# Patient Record
Sex: Female | Born: 1970 | Race: White | Hispanic: No | Marital: Single | Smoking: Never smoker
Health system: Southern US, Community
[De-identification: ages and names within clinical notes are randomized; demographics above are authoritative.]

## PROBLEM LIST (undated history)

## (undated) DIAGNOSIS — I493 Ventricular premature depolarization: Secondary | ICD-10-CM

## (undated) DIAGNOSIS — R5383 Other fatigue: Secondary | ICD-10-CM

## (undated) DIAGNOSIS — D649 Anemia, unspecified: Secondary | ICD-10-CM

## (undated) DIAGNOSIS — F419 Anxiety disorder, unspecified: Secondary | ICD-10-CM

## (undated) DIAGNOSIS — R000 Tachycardia, unspecified: Secondary | ICD-10-CM

## (undated) DIAGNOSIS — R011 Cardiac murmur, unspecified: Secondary | ICD-10-CM

## (undated) DIAGNOSIS — G629 Polyneuropathy, unspecified: Secondary | ICD-10-CM

## (undated) DIAGNOSIS — G479 Sleep disorder, unspecified: Secondary | ICD-10-CM

## (undated) DIAGNOSIS — R109 Unspecified abdominal pain: Secondary | ICD-10-CM

## (undated) DIAGNOSIS — F32A Depression, unspecified: Secondary | ICD-10-CM

## (undated) DIAGNOSIS — N939 Abnormal uterine and vaginal bleeding, unspecified: Secondary | ICD-10-CM

## (undated) DIAGNOSIS — F329 Major depressive disorder, single episode, unspecified: Secondary | ICD-10-CM

## (undated) DIAGNOSIS — J329 Chronic sinusitis, unspecified: Secondary | ICD-10-CM

## (undated) DIAGNOSIS — M2550 Pain in unspecified joint: Secondary | ICD-10-CM

## (undated) HISTORY — DX: Major depressive disorder, single episode, unspecified: F32.9

## (undated) HISTORY — DX: Tachycardia, unspecified: R00.0

## (undated) HISTORY — DX: Cardiac murmur, unspecified: R01.1

## (undated) HISTORY — DX: Unspecified abdominal pain: R10.9

## (undated) HISTORY — DX: Ventricular premature depolarization: I49.3

## (undated) HISTORY — DX: Polyneuropathy, unspecified: G62.9

## (undated) HISTORY — DX: Abnormal uterine and vaginal bleeding, unspecified: N93.9

## (undated) HISTORY — DX: Chronic sinusitis, unspecified: J32.9

## (undated) HISTORY — DX: Other fatigue: R53.83

## (undated) HISTORY — DX: Sleep disorder, unspecified: G47.9

## (undated) HISTORY — DX: Pain in unspecified joint: M25.50

## (undated) HISTORY — PX: NO PAST SURGERIES: SHX2092

## (undated) HISTORY — DX: Depression, unspecified: F32.A

---

## 2002-04-17 ENCOUNTER — Other Ambulatory Visit: Admission: RE | Admit: 2002-04-17 | Discharge: 2002-04-17 | Payer: Self-pay | Admitting: Obstetrics and Gynecology

## 2003-06-17 ENCOUNTER — Other Ambulatory Visit: Admission: RE | Admit: 2003-06-17 | Discharge: 2003-06-17 | Payer: Self-pay | Admitting: Obstetrics and Gynecology

## 2012-04-09 ENCOUNTER — Ambulatory Visit: Payer: BC Managed Care – PPO

## 2012-04-09 ENCOUNTER — Ambulatory Visit (INDEPENDENT_AMBULATORY_CARE_PROVIDER_SITE_OTHER): Payer: BC Managed Care – PPO | Admitting: Family Medicine

## 2012-04-09 VITALS — BP 124/81 | HR 90 | Temp 98.3°F | Resp 18 | Ht 66.0 in | Wt 139.0 lb

## 2012-04-09 DIAGNOSIS — R079 Chest pain, unspecified: Secondary | ICD-10-CM

## 2012-04-09 MED ORDER — KETOROLAC TROMETHAMINE 60 MG/2ML IM SOLN
60.0000 mg | Freq: Once | INTRAMUSCULAR | Status: AC
  Administered 2012-04-09: 60 mg via INTRAMUSCULAR

## 2012-04-09 MED ORDER — CYCLOBENZAPRINE HCL 10 MG PO TABS: 10.0000 mg | ORAL_TABLET | Freq: Two times a day (BID) | ORAL | Status: AC | PRN

## 2012-04-09 MED ORDER — GI COCKTAIL ~~LOC~~
30.0000 mL | Freq: Once | ORAL | Status: AC
  Administered 2012-04-09: 30 mL via ORAL

## 2012-04-09 NOTE — Progress Notes (Signed)
Date:  04/09/2012   Name:  PALOMA GRANGE   DOB:  [birth year]   MRN:  [id number]  PCP:  No primary provider on file.    Chief Complaint: Chest Pain   History of Present Illness:  [name] is a 41 y.o. very pleasant female patient who presents with the following:  Generally healthy.  This morning she woke up around 6 am feeling ok. She drank her coffee, and then developed right sided chest and back pain around 7 am.  She did not take anything for this, but came in to be seen when it did not resolve.  The pain is sharp, and it hurts to take a deep breath.    She has not recently been ill.  No cough, no hemoptysis, no calf pain. She exercises regularly- she walks and runs, and uses the elliptical trainer.  No trouble with CP wile exercising.    She has had a couple of similar episodes in the past- most recently in the fall of last year.  She was evaluated by her PCP Dr. Zachery Dauer with [facility]- had an EKG, CXR.  All looked ok, and she was diagnosed with MSK chest pain.    She does have a family history of CAD (mother) and lung cancer (father) but she herself is quite healthy as far as she knows  Never been a smoker.   There is no problem list on file for this patient.   No past medical history on file.  No past surgical history on file.  History  Substance Use Topics  . Smoking status: Never Smoker   . Smokeless tobacco: Not on file  . Alcohol Use: Not on file    No family history on file.  Not on File  Medication list has been reviewed and updated.  No current outpatient prescriptions on file prior to visit.    Review of Systems:  As per HPI- otherwise negative.   Physical Examination: Filed Vitals:   04/09/12 0933  BP: 144/91  Pulse: 85  Temp: 98.3 F (36.8 C)  Resp: 18   Filed Vitals:   04/09/12 0933  Height: 5\' 6"  (1.676 m)  Weight: 139 lb (63.05 kg)   Body mass index is 22.44 kg/(m^2). Ideal Body Weight: Weight in (lb) to have BMI = 25:  154.6   GEN: WDWN, NAD, Non-toxic, A & O x 3 HEENT: Atraumatic, Normocephalic. Neck supple. No masses, No LAD.  PEERL, EOMI, oropharynx wnl Ears and Nose: No external deformity. CV: RRR, No M/G/R. No JVD. No thrill. No extra heart sounds. PULM: CTA B, no wheezes, crackles, rhonchi. No retractions. No resp. distress. No accessory muscle use. ABD: S, NT, ND, +BS. No rebound. No HSM. EXTR: No c/c/e NEURO Normal gait.  PSYCH: Normally interactive. Conversant. Not depressed or anxious appearing.  Calm demeanor.  Able to reproduce CP by pressing on the muscles of her back to the right of her thoracic spine, and also by pressing on her right pectoralis muscle and her right side.   [facility] reading (PRIMARY) by  Dr. CXR- wnl.  CHEST - 2 VIEW  Comparison: 02/05/2011 [facility]).  Findings: Midline trachea. Normal heart size and mediastinal contours. No pleural effusion or pneumothorax. Mild nonspecific lower lobe predominant interstitial thickening; felt be similar to on the prior exam, given differences in technique. No lobar consolidation.  IMPRESSION: No acute cardiopulmonary disease.  EKG: NSR, no ST elevation or depression  GI cocktail: no difference  Toradol IM:  helped a little with her pain  Results for orders placed in visit on 04/09/12  D-DIMER, QUANTITATIVE      Component Value Range   D-Dimer, Quant 0.22  0.00 - 0.48 ug/mL-FEU   Discussed her case in detail with patient and her sister.  It seems likely that her pain is MSK, as her VS, CXR and EKG are all reassuring.  However, I cannot totally rule- out a cardiac problem, vascular problem or PE here at clinic.  Went over several options including ER evaluation and CT scan.  She decided on a D-dimer and trial of muscle relaxer and went home to await her D. Dimer result.  She agreed to proceed to the ED if she got worse in the meantime Assessment and Plan: 1. Chest pain  DG Chest 2 View, gi cocktail  (Maalox,Lidocaine,Donnatal), EKG 12-Lead, ketorolac (TORADOL) injection 60 mg, cyclobenzaprine (FLEXERIL) 10 MG tablet, D-dimer, quantitative   Normal D. Dimer.  [facility] with this result around 2:30 pm.  She reported that she was feeling a lot better.  The flexeril helped very much.  She agreed to let me know if her symptoms returned.  She will seek emergency care if severe symptoms occur.    Abbe Amsterdam, MD

## 2012-06-08 ENCOUNTER — Other Ambulatory Visit: Payer: Self-pay | Admitting: Family Medicine

## 2012-06-08 DIAGNOSIS — R0789 Other chest pain: Secondary | ICD-10-CM

## 2012-06-23 ENCOUNTER — Ambulatory Visit
Admission: RE | Admit: 2012-06-23 | Discharge: 2012-06-23 | Disposition: A | Discharge status: Home / Self Care | Payer: BC Managed Care – PPO | Source: Ambulatory Visit | Attending: Family Medicine | Admitting: Family Medicine

## 2012-06-23 DIAGNOSIS — R0789 Other chest pain: Secondary | ICD-10-CM

## 2013-04-25 ENCOUNTER — Other Ambulatory Visit: Payer: Self-pay | Admitting: Family Medicine

## 2013-04-25 DIAGNOSIS — N939 Abnormal uterine and vaginal bleeding, unspecified: Secondary | ICD-10-CM

## 2013-04-26 ENCOUNTER — Ambulatory Visit
Admission: RE | Admit: 2013-04-26 | Discharge: 2013-04-26 | Disposition: A | Discharge status: Home / Self Care | Payer: BC Managed Care – PPO | Source: Ambulatory Visit | Attending: Family Medicine | Admitting: Family Medicine

## 2013-04-26 DIAGNOSIS — N939 Abnormal uterine and vaginal bleeding, unspecified: Secondary | ICD-10-CM

## 2013-05-14 ENCOUNTER — Encounter: Payer: Self-pay | Admitting: Gynecologic Oncology

## 2013-05-15 ENCOUNTER — Ambulatory Visit: Payer: BC Managed Care – PPO | Attending: Gynecology | Admitting: Gynecology

## 2013-05-15 ENCOUNTER — Encounter: Payer: Self-pay | Admitting: Gynecology

## 2013-05-15 VITALS — BP 120/80 | HR 80 | Temp 99.8°F | Resp 18 | Ht 66.3 in | Wt 139.1 lb

## 2013-05-15 DIAGNOSIS — N946 Dysmenorrhea, unspecified: Secondary | ICD-10-CM | POA: Insufficient documentation

## 2013-05-15 DIAGNOSIS — N83209 Unspecified ovarian cyst, unspecified side: Secondary | ICD-10-CM | POA: Insufficient documentation

## 2013-05-15 DIAGNOSIS — D219 Benign neoplasm of connective and other soft tissue, unspecified: Secondary | ICD-10-CM | POA: Insufficient documentation

## 2013-05-15 DIAGNOSIS — N920 Excessive and frequent menstruation with regular cycle: Secondary | ICD-10-CM | POA: Insufficient documentation

## 2013-05-15 NOTE — Patient Instructions (Signed)
Dr Lisbeth Ply office will call you to schedule surgery

## 2013-05-15 NOTE — Progress Notes (Signed)
Consult Note: Gyn-Onc   [name] 42 y.o. female  Chief Complaint  Patient presents with  . Complex Ovarian Cyst    New Consult    Assessment : Dysmenorrhea, menometrorrhagia, associated with complex ovarian cysts and apparent uterine/endometrial fibroid or polyp. My impression that this constellation of symptoms and findings is most likely secondary to endometriosis and a fibroid. I recommend a surgical assessment to confirm the diagnosis.  Plan: I lengthy discussion with the patient and her friend regarding differential diagnosis and my opinion it is most likely endometriosis and a uterine fibroid causing her problems. She does reflect she is having severe dysmenorrhea 3-4 days each month and would like to do something definitive. We discussed management options including use oral, except as, Lupron, or more definitive surgery such as a hysterectomy with bilateral salpingo-oophorectomy. The patient is desirous of resolving this problem and therefore is inclined to go ahead with the hysterectomy and bilateral salpingo-oophorectomy.  I contacted Dr. Arelia Sneddon in his office will contact the patient to schedule surgery.   HPI:  42 year old seen in consultation request of Dr. Richardean Chimera regarding management of bilateral complex ovarian cysts. The patient reports in April she developed pelvic pain and some intermenstrual bleeding. Prior to that time the patient reports that she's had regular cyclic menses has had tablet no gynecologic issues. She's never been pregnant and has not used oral contraceptives in the past.  Imaging has been obtained revealing a uterus measuring 8.9 x 4 x 66.5 cm. There is an oval polypoid mass in the uterus measuring 3.1 x 2.0 x 2.0 cm. Attempts at performing an endometrial biopsy and a sonohysterogram have been unsuccessful. In addition the patient has bilateral ovarian cystic lesions measuring 5.6 x 4.2 x 5.4 cm. The contralateral ovary measures 5.9 x 4.9 x 5.9  cm. CA 125 was obtained and returned as 51 units per mL.  Patient has no past family history of breast or ovarian cancer.  Review of Systems:10 point review of systems is negative except as noted in interval history.   Vitals: Blood pressure 120/80, pulse 80, temperature 99.8 F (37.7 C), temperature source Oral, resp. rate 18, height 5' 6.3" (1.684 m), weight 139 lb 1.6 oz (63.095 kg).  Physical Exam: General : The patient is a healthy woman in no acute distress.  HEENT: normocephalic, extraoccular movements normal; neck is supple without thyromegally  Lynphnodes: Supraclavicular and inguinal nodes not enlarged  Abdomen: Soft, there is pain in the suprapubic region on palpation., no ascites, no organomegally, no masses, no hernias  Pelvic:  EGBUS: Normal female  Vagina: Normal, no lesions , there is blood in the vagina. Urethra and Bladder: Normal, non-tender  Cervix: Nulliparous Uterus: Anterior, slightly enlarged and boggy. Am unable to palpate adnexal masses although the patient is in some pain. Bi-manual examination: Non-tender; no adenxal masses or nodularity  Rectal: normal sphincter tone, no masses, no blood  Lower extremities: No edema or varicosities. Normal range of motion      Allergies  Allergen Reactions  . Shellfish Allergy     Face/lips goes numb    Past Medical History  Diagnosis Date  . Abnormal uterine bleeding   . Abdominal pain     History reviewed. No pertinent past surgical history.  No current outpatient prescriptions on file.   No current facility-administered medications for this visit.    History   Social History  . Marital Status: Single    Spouse Name: N/A    Number of  Children: N/A  . Years of Education: N/A   Occupational History  . Not on file.   Social History Main Topics  . Smoking status: Never Smoker   . Smokeless tobacco: Not on file  . Alcohol Use: Yes     Comment: once a week  . Drug Use: No  . Sexual Activity: No    Other Topics Concern  . Not on file   Social History Narrative  . No narrative on file    Family History  Problem Relation Age of Onset  . Heart disease Mother   . Diabetes Mother   . Lung cancer Father   . Diabetes Sister       Jeannette Corpus, MD 05/15/2013, 10:19 AM       Consult Note: Gyn-Onc   [name] 42 y.o. female  Chief Complaint  Patient presents with  . Complex Ovarian Cyst    New Consult    Assessment :  Plan:  Interval History:   HPI:  Review of Systems:10 point review of systems is negative except as noted in interval history.   Vitals: Blood pressure 120/80, pulse 80, temperature 99.8 F (37.7 C), temperature source Oral, resp. rate 18, height 5' 6.3" (1.684 m), weight 139 lb 1.6 oz (63.095 kg).  Physical Exam: General : The patient is a healthy woman in no acute distress.  HEENT: normocephalic, extraoccular movements normal; neck is supple without thyromegally  Lynphnodes: Supraclavicular and inguinal nodes not enlarged  Abdomen: Soft, non-tender, no ascites, no organomegally, no masses, no hernias  Pelvic:  EGBUS: Normal female  Vagina: Normal, no lesions  Urethra and Bladder: Normal, non-tender  Cervix: Surgically absent  Uterus: Surgically absent  Bi-manual examination: Non-tender; no adenxal masses or nodularity  Rectal: normal sphincter tone, no masses, no blood  Lower extremities: No edema or varicosities. Normal range of motion      Allergies  Allergen Reactions  . Shellfish Allergy     Face/lips goes numb    Past Medical History  Diagnosis Date  . Abnormal uterine bleeding   . Abdominal pain     History reviewed. No pertinent past surgical history.  No current outpatient prescriptions on file.   No current facility-administered medications for this visit.    History   Social History  . Marital Status: Single    Spouse Name: N/A    Number of Children: N/A  . Years of Education: N/A    Occupational History  . Not on file.   Social History Main Topics  . Smoking status: Never Smoker   . Smokeless tobacco: Not on file  . Alcohol Use: Yes     Comment: once a week  . Drug Use: No  . Sexual Activity: No   Other Topics Concern  . Not on file   Social History Narrative  . No narrative on file    Family History  Problem Relation Age of Onset  . Heart disease Mother   . Diabetes Mother   . Lung cancer Father   . Diabetes Sister       Jeannette Corpus, MD 05/15/2013, 10:19 AM

## 2013-05-25 ENCOUNTER — Ambulatory Visit: Payer: BC Managed Care – PPO | Admitting: Gynecology

## 2013-06-15 ENCOUNTER — Encounter (HOSPITAL_COMMUNITY): Payer: Self-pay | Admitting: Pharmacy Technician

## 2013-06-18 ENCOUNTER — Encounter (HOSPITAL_COMMUNITY)
Admission: RE | Admit: 2013-06-18 | Discharge: 2013-06-18 | Disposition: A | Discharge status: Home / Self Care | Payer: BC Managed Care – PPO | Source: Ambulatory Visit | Attending: Obstetrics and Gynecology | Admitting: Obstetrics and Gynecology

## 2013-06-18 ENCOUNTER — Encounter (HOSPITAL_COMMUNITY): Payer: Self-pay

## 2013-06-18 DIAGNOSIS — Z01812 Encounter for preprocedural laboratory examination: Secondary | ICD-10-CM | POA: Insufficient documentation

## 2013-06-18 DIAGNOSIS — Z01818 Encounter for other preprocedural examination: Secondary | ICD-10-CM | POA: Insufficient documentation

## 2013-06-18 HISTORY — DX: Anemia, unspecified: D64.9

## 2013-06-18 HISTORY — DX: Anxiety disorder, unspecified: F41.9

## 2013-06-18 LAB — CBC
HCT: 41.9 % (ref 36.0–46.0)
Platelets: 389 10*3/uL (ref 150–400)
RDW: 13.4 % (ref 11.5–15.5)
WBC: 7 10*3/uL (ref 4.0–10.5)

## 2013-06-18 NOTE — Patient Instructions (Signed)
Your procedure is scheduled on:06/25/13  Enter through the Main Entrance at :6am Pick up desk phone and dial [phone] and inform us of your arrival.  Please call [phone] if you have any problems the morning of surgery.  Remember: Do not eat food or drink liquids, including water, after midnight:Sunday  You may brush your teeth the morning of surgery.   DO NOT wear jewelry, eye make-up, lipstick,body lotion, or dark fingernail polish.  (Polished toes are ok) You may wear deodorant.  If you are to be admitted after surgery, leave suitcase in car until your room has been assigned. Patients discharged on the day of surgery will not be allowed to drive home. Wear loose fitting, comfortable clothes for your ride home.

## 2013-06-20 NOTE — H&P (Signed)
  Patient name  Sally-Anne, Wamble DICTATION#  [id number] CSN# [id number]  Juluis Mire, MD 06/20/2013 7:07 AM

## 2013-06-21 NOTE — H&P (Signed)
[name], [name] NO.:  [id number]  MEDICAL RECORD NO.:  [id number]  LOCATION:  PERIO                         FACILITY:  [facility]  PHYSICIAN:  Juluis Mire, M.D.   DATE OF BIRTH:  01/01/1971  DATE OF ADMISSION:  05/18/2013 DATE OF DISCHARGE:                             HISTORY & PHYSICAL   DATE OF SURGERY:  June 25, 2013, at [facility] here in [place].  HISTORY OF PRESENT ILLNESS:  The patient is a 42 year old nulligravida female, initially referred to our practice by Dr. Zachery Dauer for evaluation of abnormal uterine bleeding.  Cycles were monthly, occurring approximately every 3-1/2 weeks.  She has 5 days of flow, 2 days being heavy, changing pads every hour with clots and worsening pain and discomfort.  She had a previous ultrasound that revealed a possible polypoid mass seen within the endometrial cavity and complex cyst of both ovaries.  We did an ultrasound in the office.  We had an inability to do the saline infusion due to the inability to find the cervical os. Her right ovary had a 6 cm cyst diffusely filled with echoes, probably endometrioma.  Left ovary had a 3.9 x 3.5 complex cyst, diffusely filled with echoes and thick septation.  There was a 4 mm solid area, another multiple cyst that did look like bilateral endometriomas.  We did a CA- 125 on the patient.  It was mildly elevated with a value of 51.2.  She was referred to the GYN oncologist and saw Dr. De Blanch. He felt after evaluation that this likely represented endometriosis and recommended proceeding with hysterectomy with possibly bilateral salpingo-oophorectomy.  The patient concurs with this and is admitted at the present time.  Dr. De Blanch did not believe that this was a GYN malignancy.  ALLERGIES:  She is allergic to [facility].  MEDICATIONS:  She is on no medications.  PAST SURGERY HISTORY:  She has had no surgical procedures done and has had no  vaginal deliveries.  SOCIAL HISTORY:  There is no tobacco or alcohol use.  FAMILY HISTORY:  There is a history of heart disease and diabetes in her mother.  Father had lung cancer.  Sister also had diabetes.  REVIEW OF SYSTEMS:  Noncontributory.  PHYSICAL EXAMINATION:  VITAL SIGNS:  The patient is afebrile with stable vital signs. HEENT:  The patient is normocephalic.  Pupils equal, round, and reactive to light and accommodation.  Extraocular movements were intact.  Sclerae and conjunctivae were clear.  Oropharynx clear. NECK:  Without thyromegaly. BREASTS:  Not examined. LUNGS:  Clear. CARDIOVASCULAR:  Regular rate without murmurs or gallops. ABDOMEN:  Benign.  No masses, organomegaly, or tenderness. PELVIC:  Normal external genitalia.  Vaginal mucosa is clear.  Cervix unremarkable.  Uterus is of normal size and shape.  She has bilateral adnexal fullness and tenderness consistent with known complex cyst. EXTREMITIES:  Trace edema. NEUROLOGIC:  Grossly normal.  IMPRESSION:  Severe pelvic endometriosis with bilateral endometriomas.  PLAN:  Options have been discussed for management.  We can be conservative and proceed with laparoscopic evaluation with attempt of bilateral ovarian cystectomy.  We can suppress with Lupron prior to this.  She wants to proceed with more  definitive surgery.  Therefore, we are going to proceed with an abdominal approach to the hysterectomy.  We had extreme difficulty even doing a Pap smear on her.  Therefore, I think a vaginal approach would be excluded.  She states that if one ovaries look normal, possibly leaving that would be an option.  If we do a total abdominal hysterectomy and bilateral salpingo-oophorectomy, she will definitely need hormone replacement therapy, __________ symptomatology and reduction of cardiovascular issues.  The patient expressed her understanding __________ obviously we could run into a malignancy.  If that is true, further  staging surgical procedures may be indicated.  The risks of surgery explained including the risk of infection, the risk of hemorrhage, could require transfusion with the risk of AIDS or hepatitis.  Risk of injury to adjacent organs such as bladder, bowel, or ureters that could require further exploratory surgery.  Risk of deep venous thrombosis and pulmonary embolus.  The patient expressed her understanding of the indications, risks, and alternatives.     Juluis Mire, M.D.     JSM/MEDQ  D:  06/20/2013  T:  06/20/2013  Job:  [id number]

## 2013-06-24 MED ORDER — CIPROFLOXACIN IN D5W 400 MG/200ML IV SOLN
400.0000 mg | INTRAVENOUS | Status: AC
  Administered 2013-06-25: 400 mg via INTRAVENOUS
  Filled 2013-06-24: qty 200

## 2013-06-24 MED ORDER — CLINDAMYCIN PHOSPHATE 900 MG/50ML IV SOLN
900.0000 mg | INTRAVENOUS | Status: AC
  Administered 2013-06-25: 900 mg via INTRAVENOUS
  Filled 2013-06-24: qty 50

## 2013-06-25 ENCOUNTER — Encounter (HOSPITAL_COMMUNITY)
Admission: RE | Disposition: A | Discharge status: Home / Self Care | Payer: Self-pay | Source: Ambulatory Visit | Attending: Obstetrics and Gynecology

## 2013-06-25 ENCOUNTER — Encounter (HOSPITAL_COMMUNITY): Payer: Self-pay | Admitting: *Deleted

## 2013-06-25 ENCOUNTER — Inpatient Hospital Stay (HOSPITAL_COMMUNITY): Payer: BC Managed Care – PPO | Admitting: Anesthesiology

## 2013-06-25 ENCOUNTER — Encounter (HOSPITAL_COMMUNITY): Payer: Self-pay | Admitting: Anesthesiology

## 2013-06-25 ENCOUNTER — Observation Stay (HOSPITAL_COMMUNITY)
Admission: RE | Admit: 2013-06-25 | Discharge: 2013-06-27 | Disposition: A | Discharge status: Home / Self Care | Payer: BC Managed Care – PPO | Source: Ambulatory Visit | Attending: Obstetrics and Gynecology | Admitting: Obstetrics and Gynecology

## 2013-06-25 DIAGNOSIS — N831 Corpus luteum cyst of ovary, unspecified side: Secondary | ICD-10-CM | POA: Insufficient documentation

## 2013-06-25 DIAGNOSIS — N809 Endometriosis, unspecified: Secondary | ICD-10-CM | POA: Diagnosis present

## 2013-06-25 DIAGNOSIS — Z90710 Acquired absence of both cervix and uterus: Secondary | ICD-10-CM

## 2013-06-25 DIAGNOSIS — N8000 Endometriosis of the uterus, unspecified: Secondary | ICD-10-CM | POA: Insufficient documentation

## 2013-06-25 DIAGNOSIS — D250 Submucous leiomyoma of uterus: Secondary | ICD-10-CM | POA: Insufficient documentation

## 2013-06-25 DIAGNOSIS — N801 Endometriosis of ovary: Secondary | ICD-10-CM | POA: Insufficient documentation

## 2013-06-25 DIAGNOSIS — N80109 Endometriosis of ovary, unspecified side, unspecified depth: Secondary | ICD-10-CM | POA: Insufficient documentation

## 2013-06-25 DIAGNOSIS — N840 Polyp of corpus uteri: Secondary | ICD-10-CM | POA: Insufficient documentation

## 2013-06-25 DIAGNOSIS — N803 Endometriosis of pelvic peritoneum, unspecified: Principal | ICD-10-CM | POA: Insufficient documentation

## 2013-06-25 HISTORY — PX: ABDOMINAL HYSTERECTOMY: SHX81

## 2013-06-25 HISTORY — PX: SALPINGOOPHORECTOMY: SHX82

## 2013-06-25 SURGERY — HYSTERECTOMY, ABDOMINAL
Anesthesia: General | Site: Abdomen | Wound class: Clean Contaminated

## 2013-06-25 MED ORDER — MIDAZOLAM HCL 2 MG/2ML IJ SOLN
INTRAMUSCULAR | Status: AC
  Filled 2013-06-25: qty 2

## 2013-06-25 MED ORDER — LIDOCAINE HCL (CARDIAC) 20 MG/ML IV SOLN
INTRAVENOUS | Status: DC | PRN
  Administered 2013-06-25: 40 mg via INTRAVENOUS
  Administered 2013-06-25: 20 mg via INTRAVENOUS

## 2013-06-25 MED ORDER — NALOXONE HCL 0.4 MG/ML IJ SOLN: 0.4000 mg | INTRAMUSCULAR | Status: DC | PRN

## 2013-06-25 MED ORDER — HYDROMORPHONE 0.3 MG/ML IV SOLN
INTRAVENOUS | Status: DC
  Administered 2013-06-25: 11:00:00 via INTRAVENOUS
  Administered 2013-06-25: 2.59 mg via INTRAVENOUS
  Administered 2013-06-25: 2.33 mg via INTRAVENOUS
  Administered 2013-06-25: 21:00:00 via INTRAVENOUS
  Administered 2013-06-25: 2.39 mg via INTRAVENOUS
  Administered 2013-06-26 (×2): 0.799 mg via INTRAVENOUS
  Administered 2013-06-26: 0.999 mg via INTRAVENOUS
  Filled 2013-06-25 (×2): qty 25

## 2013-06-25 MED ORDER — BUPIVACAINE LIPOSOME 1.3 % IJ SUSP
20.0000 mL | Freq: Once | INTRAMUSCULAR | Status: AC
  Administered 2013-06-25: 20 mL
  Filled 2013-06-25: qty 20

## 2013-06-25 MED ORDER — LACTATED RINGERS IV BOLUS (SEPSIS)
500.0000 mL | Freq: Once | INTRAVENOUS | Status: AC
  Administered 2013-06-25: 500 mL via INTRAVENOUS

## 2013-06-25 MED ORDER — OXYCODONE-ACETAMINOPHEN 5-325 MG PO TABS
1.0000 | ORAL_TABLET | ORAL | Status: DC | PRN
  Administered 2013-06-26: 2 via ORAL
  Filled 2013-06-25: qty 2

## 2013-06-25 MED ORDER — PROPOFOL 10 MG/ML IV EMUL
INTRAVENOUS | Status: AC
  Filled 2013-06-25: qty 20

## 2013-06-25 MED ORDER — DIPHENHYDRAMINE HCL 50 MG/ML IJ SOLN
INTRAMUSCULAR | Status: AC
  Filled 2013-06-25: qty 1

## 2013-06-25 MED ORDER — MEPERIDINE HCL 25 MG/ML IJ SOLN: 6.2500 mg | INTRAMUSCULAR | Status: DC | PRN

## 2013-06-25 MED ORDER — SCOPOLAMINE 1 MG/3DAYS TD PT72
1.0000 | MEDICATED_PATCH | TRANSDERMAL | Status: DC
  Administered 2013-06-25: 1.5 mg via TRANSDERMAL

## 2013-06-25 MED ORDER — METOCLOPRAMIDE HCL 5 MG/ML IJ SOLN
10.0000 mg | Freq: Once | INTRAMUSCULAR | Status: AC | PRN
  Administered 2013-06-25: 10 mg via INTRAVENOUS

## 2013-06-25 MED ORDER — HYDROMORPHONE HCL PF 1 MG/ML IJ SOLN
INTRAMUSCULAR | Status: AC
  Filled 2013-06-25: qty 1

## 2013-06-25 MED ORDER — PHENYLEPHRINE 40 MCG/ML (10ML) SYRINGE FOR IV PUSH (FOR BLOOD PRESSURE SUPPORT)
PREFILLED_SYRINGE | INTRAVENOUS | Status: AC
  Filled 2013-06-25: qty 5

## 2013-06-25 MED ORDER — NEOSTIGMINE METHYLSULFATE 1 MG/ML IJ SOLN
INTRAMUSCULAR | Status: AC
  Filled 2013-06-25: qty 1

## 2013-06-25 MED ORDER — PROMETHAZINE HCL 25 MG/ML IJ SOLN
INTRAMUSCULAR | Status: AC
  Administered 2013-06-25: 6.25 mg
  Filled 2013-06-25: qty 1

## 2013-06-25 MED ORDER — ROCURONIUM BROMIDE 100 MG/10ML IV SOLN
INTRAVENOUS | Status: DC | PRN
  Administered 2013-06-25: 10 mg via INTRAVENOUS
  Administered 2013-06-25: 40 mg via INTRAVENOUS

## 2013-06-25 MED ORDER — SCOPOLAMINE 1 MG/3DAYS TD PT72
MEDICATED_PATCH | TRANSDERMAL | Status: AC
  Filled 2013-06-25: qty 1

## 2013-06-25 MED ORDER — SODIUM CHLORIDE 0.9 % IJ SOLN: 9.0000 mL | INTRAMUSCULAR | Status: DC | PRN

## 2013-06-25 MED ORDER — GLYCOPYRROLATE 0.2 MG/ML IJ SOLN
INTRAMUSCULAR | Status: DC | PRN
  Administered 2013-06-25: 0.6 mg via INTRAVENOUS

## 2013-06-25 MED ORDER — PROPOFOL 10 MG/ML IV BOLUS
INTRAVENOUS | Status: DC | PRN
  Administered 2013-06-25: 160 mg via INTRAVENOUS

## 2013-06-25 MED ORDER — ZOLPIDEM TARTRATE 5 MG PO TABS: 5.0000 mg | ORAL_TABLET | Freq: Every evening | ORAL | Status: DC | PRN

## 2013-06-25 MED ORDER — ROCURONIUM BROMIDE 50 MG/5ML IV SOLN
INTRAVENOUS | Status: AC
  Filled 2013-06-25: qty 1

## 2013-06-25 MED ORDER — METOCLOPRAMIDE HCL 5 MG/ML IJ SOLN
INTRAMUSCULAR | Status: AC
  Filled 2013-06-25: qty 2

## 2013-06-25 MED ORDER — GLYCOPYRROLATE 0.2 MG/ML IJ SOLN
INTRAMUSCULAR | Status: AC
  Filled 2013-06-25: qty 3

## 2013-06-25 MED ORDER — ONDANSETRON HCL 4 MG/2ML IJ SOLN
4.0000 mg | Freq: Four times a day (QID) | INTRAMUSCULAR | Status: DC | PRN
  Filled 2013-06-25: qty 2

## 2013-06-25 MED ORDER — KETOROLAC TROMETHAMINE 30 MG/ML IJ SOLN
INTRAMUSCULAR | Status: DC | PRN
  Administered 2013-06-25: 30 mg via INTRAVENOUS

## 2013-06-25 MED ORDER — ONDANSETRON HCL 4 MG/2ML IJ SOLN
4.0000 mg | Freq: Four times a day (QID) | INTRAMUSCULAR | Status: DC | PRN
  Administered 2013-06-25: 4 mg via INTRAVENOUS

## 2013-06-25 MED ORDER — NEOSTIGMINE METHYLSULFATE 1 MG/ML IJ SOLN
INTRAMUSCULAR | Status: DC | PRN
  Administered 2013-06-25: 4 mg via INTRAVENOUS

## 2013-06-25 MED ORDER — DIPHENHYDRAMINE HCL 50 MG/ML IJ SOLN
INTRAMUSCULAR | Status: DC | PRN
  Administered 2013-06-25: 25 mg via INTRAVENOUS

## 2013-06-25 MED ORDER — DEXAMETHASONE SODIUM PHOSPHATE 10 MG/ML IJ SOLN
INTRAMUSCULAR | Status: DC | PRN
  Administered 2013-06-25: 10 mg via INTRAVENOUS

## 2013-06-25 MED ORDER — KETOROLAC TROMETHAMINE 30 MG/ML IJ SOLN
INTRAMUSCULAR | Status: AC
  Filled 2013-06-25: qty 1

## 2013-06-25 MED ORDER — LACTATED RINGERS IV SOLN
INTRAVENOUS | Status: DC
  Administered 2013-06-25: 50 mL/h via INTRAVENOUS

## 2013-06-25 MED ORDER — ONDANSETRON HCL 4 MG PO TABS: 4.0000 mg | ORAL_TABLET | Freq: Four times a day (QID) | ORAL | Status: DC | PRN

## 2013-06-25 MED ORDER — FENTANYL CITRATE 0.05 MG/ML IJ SOLN
INTRAMUSCULAR | Status: AC
  Filled 2013-06-25: qty 5

## 2013-06-25 MED ORDER — MENTHOL 3 MG MT LOZG: 1.0000 | LOZENGE | OROMUCOSAL | Status: DC | PRN

## 2013-06-25 MED ORDER — ONDANSETRON HCL 4 MG/2ML IJ SOLN
INTRAMUSCULAR | Status: AC
  Filled 2013-06-25: qty 2

## 2013-06-25 MED ORDER — DIPHENHYDRAMINE HCL 50 MG/ML IJ SOLN: 12.5000 mg | Freq: Four times a day (QID) | INTRAMUSCULAR | Status: DC | PRN

## 2013-06-25 MED ORDER — ACETAMINOPHEN 325 MG PO TABS
650.0000 mg | ORAL_TABLET | ORAL | Status: DC | PRN
  Administered 2013-06-26: 650 mg via ORAL
  Filled 2013-06-25: qty 2

## 2013-06-25 MED ORDER — ONDANSETRON HCL 4 MG/2ML IJ SOLN
INTRAMUSCULAR | Status: DC | PRN
  Administered 2013-06-25: 4 mg via INTRAVENOUS

## 2013-06-25 MED ORDER — LACTATED RINGERS IV SOLN
INTRAVENOUS | Status: DC
  Administered 2013-06-25 – 2013-06-26 (×3): via INTRAVENOUS

## 2013-06-25 MED ORDER — DIPHENHYDRAMINE HCL 12.5 MG/5ML PO ELIX: 12.5000 mg | ORAL_SOLUTION | Freq: Four times a day (QID) | ORAL | Status: DC | PRN

## 2013-06-25 MED ORDER — FENTANYL CITRATE 0.05 MG/ML IJ SOLN
INTRAMUSCULAR | Status: DC | PRN
  Administered 2013-06-25 (×4): 50 ug via INTRAVENOUS

## 2013-06-25 MED ORDER — PHENYLEPHRINE HCL 10 MG/ML IJ SOLN
INTRAMUSCULAR | Status: DC | PRN
  Administered 2013-06-25: 80 ug via INTRAVENOUS
  Administered 2013-06-25 (×3): 40 ug via INTRAVENOUS

## 2013-06-25 MED ORDER — LIDOCAINE HCL (CARDIAC) 20 MG/ML IV SOLN
INTRAVENOUS | Status: AC
  Filled 2013-06-25: qty 5

## 2013-06-25 MED ORDER — HYDROMORPHONE HCL PF 1 MG/ML IJ SOLN
0.2500 mg | INTRAMUSCULAR | Status: DC | PRN
  Administered 2013-06-25 (×3): 0.5 mg via INTRAVENOUS

## 2013-06-25 MED ORDER — DEXAMETHASONE SODIUM PHOSPHATE 10 MG/ML IJ SOLN
INTRAMUSCULAR | Status: AC
  Filled 2013-06-25: qty 1

## 2013-06-25 MED ORDER — MIDAZOLAM HCL 2 MG/2ML IJ SOLN
INTRAMUSCULAR | Status: DC | PRN
  Administered 2013-06-25 (×2): 1 mg via INTRAVENOUS

## 2013-06-25 SURGICAL SUPPLY — 31 items
CANISTER SUCTION 2500CC (MISCELLANEOUS) ×3 IMPLANT
CLOTH BEACON ORANGE TIMEOUT ST (SAFETY) ×3 IMPLANT
DECANTER SPIKE VIAL GLASS SM (MISCELLANEOUS) IMPLANT
DRSG OPSITE POSTOP 4X10 (GAUZE/BANDAGES/DRESSINGS) ×1 IMPLANT
GLOVE BIO SURGEON STRL SZ7 (GLOVE) ×3 IMPLANT
GOWN STRL REIN XL XLG (GOWN DISPOSABLE) ×9 IMPLANT
NDL HYPO 25X1 1.5 SAFETY (NEEDLE) IMPLANT
NEEDLE HYPO 25X1 1.5 SAFETY (NEEDLE) IMPLANT
NS IRRIG 1000ML POUR BTL (IV SOLUTION) ×3 IMPLANT
PACK ABDOMINAL GYN (CUSTOM PROCEDURE TRAY) ×3 IMPLANT
PAD OB MATERNITY 4.3X12.25 (PERSONAL CARE ITEMS) ×3 IMPLANT
PROTECTOR NERVE ULNAR (MISCELLANEOUS) ×3 IMPLANT
SPONGE LAP 18X18 X RAY DECT (DISPOSABLE) ×5 IMPLANT
STAPLER VISISTAT 35W (STAPLE) ×3 IMPLANT
STRIP CLOSURE SKIN 1/4X4 (GAUZE/BANDAGES/DRESSINGS) ×3 IMPLANT
SUT CHROMIC 0 SH (SUTURE) IMPLANT
SUT CHROMIC 2 0 SH (SUTURE) IMPLANT
SUT CHROMIC 3 0 SH 27 (SUTURE) IMPLANT
SUT PDS AB 0 CT 36 (SUTURE) ×3 IMPLANT
SUT VIC AB 0 CT1 18XCR BRD8 (SUTURE) ×6 IMPLANT
SUT VIC AB 0 CT1 27 (SUTURE) ×3
SUT VIC AB 0 CT1 27XBRD ANBCTR (SUTURE) ×2 IMPLANT
SUT VIC AB 0 CT1 8-18 (SUTURE) ×6
SUT VIC AB 2-0 CT1 (SUTURE) ×3 IMPLANT
SUT VIC AB 2-0 SH 27 (SUTURE) ×3
SUT VIC AB 2-0 SH 27XBRD (SUTURE) IMPLANT
SUT VICRYL 0 TIES 12 18 (SUTURE) ×3 IMPLANT
SYR CONTROL 10ML LL (SYRINGE) IMPLANT
TOWEL OR 17X24 6PK STRL BLUE (TOWEL DISPOSABLE) ×6 IMPLANT
TRAY FOLEY CATH 14FR (SET/KITS/TRAYS/PACK) ×3 IMPLANT
WATER STERILE IRR 1000ML POUR (IV SOLUTION) ×2 IMPLANT

## 2013-06-25 NOTE — H&P (Signed)
  History and physical exam unchanged 

## 2013-06-25 NOTE — Brief Op Note (Signed)
06/25/2013  8:59 AM  PATIENT:  [name]  42 y.o. female  PRE-OPERATIVE DIAGNOSIS:  ENDOMETRIOSIS CPT 58150  POST-OPERATIVE DIAGNOSIS:  ENDOMETRIOSIS  PROCEDURE:  Procedure(s): HYSTERECTOMY ABDOMINAL (N/A) SALPINGO OOPHORECTOMY (Bilateral)  SURGEON:  Surgeon(s) and Role:    * Juluis Mire, MD - Primary    * Mitchel Honour, DO - Assisting  PHYSICIAN ASSISTANT:   ASSISTANTS: megan morris  ANESTHESIA:   general  EBL:  Total I/O In: 1500 [I.V.:1500] Out: 160 [Urine:10; Blood:150]  BLOOD ADMINISTERED:none  DRAINS: Urinary Catheter (Foley)   LOCAL MEDICATIONS USED:  OTHER enperel  SPECIMEN:  Source of Specimen:  uterus tubes and ovaries  DISPOSITION OF SPECIMEN:  PATHOLOGY  COUNTS:  YES  TOURNIQUET:  * No tourniquets in log *  DICTATION: .Other Dictation: Dictation Number [id number]  PLAN OF CARE: Admit for overnight observation  PATIENT DISPOSITION:  PACU - hemodynamically stable.   Delay start of Pharmacological VTE agent (>24hrs) due to surgical blood loss or risk of bleeding: no

## 2013-06-25 NOTE — Transfer of Care (Signed)
Immediate Anesthesia Transfer of Care Note  Patient: [name]  Procedure(s) Performed: Procedure(s): HYSTERECTOMY ABDOMINAL (N/A) SALPINGO OOPHORECTOMY (Bilateral)  Patient Location: PACU  Anesthesia Type:General  Level of Consciousness: awake, sedated and patient cooperative  Airway & Oxygen Therapy: Patient Spontanous Breathing and Patient connected to nasal cannula oxygen  Post-op Assessment: Report given to PACU RN and Post -op Vital signs reviewed and stable  Post vital signs: Reviewed and stable  Complications: No apparent anesthesia complications

## 2013-06-25 NOTE — Anesthesia Preprocedure Evaluation (Signed)
Anesthesia Evaluation  Patient identified by MRN, date of birth, ID band Patient awake    Reviewed: Allergy & Precautions, H&P , NPO status , Patient's Chart, lab work & pertinent test results  Airway       Dental   Pulmonary neg pulmonary ROS,          Cardiovascular negative cardio ROS      Neuro/Psych PSYCHIATRIC DISORDERS Anxiety negative neurological ROS     GI/Hepatic negative GI ROS, Neg liver ROS,   Endo/Other  negative endocrine ROS  Renal/GU negative Renal ROS     Musculoskeletal negative musculoskeletal ROS (+)   Abdominal   Peds  Hematology negative hematology ROS (+)   Anesthesia Other Findings   Reproductive/Obstetrics negative OB ROS                           Anesthesia Physical Anesthesia Plan  ASA: I  Anesthesia Plan: General   Post-op Pain Management:    Induction: Intravenous  Airway Management Planned: Oral ETT  Additional Equipment:   Intra-op Plan:   Post-operative Plan: Extubation in OR  Informed Consent: I have reviewed the patients History and Physical, chart, labs and discussed the procedure including the risks, benefits and alternatives for the proposed anesthesia with the patient or authorized representative who has indicated his/her understanding and acceptance.   Dental advisory given  Plan Discussed with: CRNA  Anesthesia Plan Comments:         Anesthesia Quick Evaluation

## 2013-06-25 NOTE — Anesthesia Postprocedure Evaluation (Signed)
  Anesthesia Post-op Note  Patient: [name]  Procedure(s) Performed: Procedure(s): HYSTERECTOMY ABDOMINAL (N/A) SALPINGO OOPHORECTOMY (Bilateral)  Patient Location: Women's Unit  Anesthesia Type:General  Level of Consciousness: awake, alert , oriented and patient cooperative  Airway and Oxygen Therapy: Patient Spontanous Breathing and Patient connected to nasal cannula oxygen  Post-op Pain: moderate complaint of pain, nursing staff notified  Post-op Assessment: Patient's Cardiovascular Status Stable, Respiratory Function Stable, Patent Airway, No signs of Nausea or vomiting, Adequate PO intake and Pain level controlled  Post-op Vital Signs: Reviewed and stable  Complications: No apparent anesthesia complications

## 2013-06-25 NOTE — Anesthesia Postprocedure Evaluation (Signed)
  Anesthesia Post-op Note  Patient: [name]  Procedure(s) Performed: Procedure(s): HYSTERECTOMY ABDOMINAL (N/A) SALPINGO OOPHORECTOMY (Bilateral)  Patient Location: PACU  Anesthesia Type:General  Level of Consciousness: awake, alert  and oriented  Airway and Oxygen Therapy: Patient Spontanous Breathing  Post-op Pain: mild  Post-op Assessment: Post-op Vital signs reviewed, Patient's Cardiovascular Status Stable, Respiratory Function Stable, Patent Airway, NAUSEA AND VOMITING PRESENT and Pain level controlled  Post-op Vital Signs: Reviewed and stable  Complications: No apparent anesthesia complications other than PON. Patient had presumed allergic reaction to Ciprofloxacin intraoperative. Dr. Arelia Sneddon informed

## 2013-06-26 ENCOUNTER — Encounter (HOSPITAL_COMMUNITY): Payer: Self-pay | Admitting: Obstetrics and Gynecology

## 2013-06-26 LAB — CBC
HCT: 30.3 % — ABNORMAL LOW (ref 36.0–46.0)
Hemoglobin: 10.1 g/dL — ABNORMAL LOW (ref 12.0–15.0)
MCHC: 33.3 g/dL (ref 30.0–36.0)
MCV: 89.4 fL (ref 78.0–100.0)
RDW: 13.5 % (ref 11.5–15.5)
WBC: 14.6 10*3/uL — ABNORMAL HIGH (ref 4.0–10.5)

## 2013-06-26 MED ORDER — HYDROMORPHONE HCL 2 MG PO TABS
2.0000 mg | ORAL_TABLET | ORAL | Status: DC | PRN
  Administered 2013-06-26 – 2013-06-27 (×7): 2 mg via ORAL
  Filled 2013-06-26 (×7): qty 1

## 2013-06-26 MED ORDER — IBUPROFEN 600 MG PO TABS
600.0000 mg | ORAL_TABLET | Freq: Four times a day (QID) | ORAL | Status: DC | PRN
  Administered 2013-06-26 – 2013-06-27 (×3): 600 mg via ORAL
  Filled 2013-06-26 (×3): qty 1

## 2013-06-26 MED ORDER — DOCUSATE SODIUM 100 MG PO CAPS
100.0000 mg | ORAL_CAPSULE | Freq: Once | ORAL | Status: AC
  Administered 2013-06-26: 100 mg via ORAL
  Filled 2013-06-26: qty 1

## 2013-06-26 NOTE — Op Note (Signed)
[name], [name] NO.:  [id number]  MEDICAL RECORD NO.:  [id number]  LOCATION:  9305                          FACILITY:  [facility]  PHYSICIAN:  Juluis Mire, M.D.   DATE OF BIRTH:  April 06, 1971  DATE OF PROCEDURE:  06/25/2013 DATE OF DISCHARGE:                              OPERATIVE REPORT   PREOPERATIVE DIAGNOSIS:  Bilateral cystic enlargement of the ovaries with probable endometriosis.  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  OPERATIVE PROCEDURE:  Total abdominal hysterectomy with bilateral salpingo-oophorectomy.  SURGEON:  Juluis Mire, M.D.  ASSISTANT:  Meridee Score.  ANESTHESIA:  General endotracheal.  ESTIMATED BLOOD LOSS:  150 mL.  PACKS AND DRAINS:  Drains included urethral Foley.  INTRAOPERATIVE BLOOD PLACED:  None.  COMPLICATIONS:  None.  INDICATION:  Dictated in history and physical.  PROCEDURE:  As follows:  The patient was taken to the OR and placed in supine position.  After satisfactory level of general anesthesia was obtained, the abdomen was prepped with an antiseptic solution.  Vagina was also prepped with an antiseptic solution.  A Foley was placed to straight drain.  The patient then draped in sterile field.  A low- transverse skin incision was made with knife, carried through subcutaneous tissue.  Anterior rectus fascia then sharply incised and fashioned laterally.  Fascia taken off the muscle superiorly and inferiorly.  Rectus muscles were separated in midline.  Anterior perineum was entered sharply.  Incision of perineum extended both superiorly and inferiorly.  At this point in time, Lenox Ahr retractor was put in place.  Pelvic washings were obtained and sent for cytology.  Visualization showed cystic enlargement of the right ovary measuring about 6 cm consistent with endometrioma.  Left ovary was adherent to the sigmoid colon and also had an  endometrioma and adhesions.  We were able to free up first the right ovary just with blunt finger dissection.  We were able to do the same thing to the left tube and left ovary.  At this point in time, the cul-de-sac was also freed up.  We made sure the bowel was not adherent to the back of the uterus.  We did do some sharp dissection removing the tube and ovary from the sigmoid.  At this point in time, the right round ligament was clamped, cut, and suture ligated with 0-Vicryl.  The right utero-ovarian pedicle was isolated, clamped, and cut.  We secured the ovarian vascular with free tie of 0-Vicryl.  At this point in time, we elevated the ovary.  We developed retroperitoneal space on the right side, identified the ureter, isolated the ovarian vasculature above this, clamped it. Excised that and the ovary was passed off the operative field.  The pedicle was secured first with a free tie of 0-Vicryl then a suture ligature of 0-Vicryl.  At this point in time, the bladder flap was developed.  The right uterine vessels were clamped, cut, and suture ligated with 0-Vicryl.  We then went to the left side.  We developed the left retroperitoneal space.  We were able to identify the ureter, we isolated the ovarian vasculature above this, clamped it and cut, and  sutured this first with a free tie of 0-Vicryl then suture ligature of 0- Vicryl.  Keeping the ureter, I might be able to dissect the ovary and tube up to the side of the uterus.  The broad ligament was thick in this area and we dissected it out.  At this point in time, the bladder flap was fully developed, then the uterine vessels on the left side were clamped, cut, and suture ligated with 0-Vicryl.  Bladder was dissected superiorly off the lower uterine cervical segment using clamp, cut, and tie technique with suture ligature of 0-Vicryl, parametrium serially separated from the sized uterus.  Vaginal angles were then clamped and cut.   Intervening vaginal mucosa was excised and the uterus was passed off the operative field.  At this point in time, the vaginal angle was secured with suture ligatures of 0-Vicryl.  The intervening vaginal mucosa was closed with interrupted figure-of-eights of 0-Vicryl.  We then went to the left side.  There was a part of the ovary still adherent to the left pelvic sidewall.  We elevated this with a Babcock. Again, we identified the ureter and we were able to peel this off and send it for pathological specimen along with the rest of the ovary.  We irrigated the pelvis.  At this point in time, we had good hemostasis and clear urine output.  Appendix was visualized, noted to be normal and not involved.  Again, we irrigated the pelvis and had good hemostasis.  The Lenox Ahr retractor was removed along with the 2 packs. Peritoneum was closed with running suture of 3-0 Vicryl.  Fascia was closed with running suture of 0-PDS.  Skin was closed with a running subcuticular 4-0 Monocryl.  Exparel was injected subcutaneously prior to closing the skin.  We used approximately 20 mL.  Steri-Strips were also applied.  Sponge, instrument, and needle counts were reported as correct by circulating nurse x2.  Foley catheter remained clear at the time of closure.  The patient tolerated the procedure well, was returned to recovery room in good condition.     Juluis Mire, M.D.     JSM/MEDQ  D:  06/25/2013  T:  06/26/2013  Job:  [id number]

## 2013-06-26 NOTE — Progress Notes (Signed)
1 Day Post-Op Procedure([name]) (LRB): HYSTERECTOMY ABDOMINAL (N/A) SALPINGO OOPHORECTOMY (Bilateral)  Subjective: Patient reports incisional pain and tolerating PO.    Objective: I have reviewed patient'[name] vital signs and labs.  General: alert GI: soft, non-tender; bowel sounds normal; no masses,  no organomegaly and incision: clean Vaginal Bleeding: minimal  Assessment: [name]/p Procedure([name]): HYSTERECTOMY ABDOMINAL (N/A) SALPINGO OOPHORECTOMY (Bilateral): stable  Plan: Advance diet Discontinue IV fluids  LOS: 1 day    Michelle [name] 06/26/2013, 8:13 AM

## 2013-06-27 MED ORDER — DOCUSATE SODIUM 100 MG PO CAPS: 100.0000 mg | ORAL_CAPSULE | Freq: Two times a day (BID) | ORAL | Status: DC

## 2013-06-27 MED ORDER — HYDROMORPHONE HCL 2 MG PO TABS: 2.0000 mg | ORAL_TABLET | ORAL | Status: DC | PRN

## 2013-06-27 NOTE — Discharge Summary (Signed)
  Patient name  [name], [name] DICTATION# CSN# [id number]  [facility], MD 06/27/2013 8:28 AM

## 2013-06-27 NOTE — Progress Notes (Signed)
Pt is stable condition. Patient ambulated with sister to car. Walked with Heron Nay, RN. Will be going home. No equipment taken home.

## 2013-06-27 NOTE — Progress Notes (Signed)
2 Days Post-Op Procedure(s) (LRB): HYSTERECTOMY ABDOMINAL (N/A) SALPINGO OOPHORECTOMY (Bilateral)  Subjective: Patient reports incisional pain, tolerating PO and no problems voiding.    Objective: I have reviewed patient's vital signs.  General: alert and no distress GI: soft, non-tender; bowel sounds normal; no masses,  no organomegaly and incision: clean Vaginal Bleeding: minimal  Assessment: s/p Procedure(s): HYSTERECTOMY ABDOMINAL (N/A) SALPINGO OOPHORECTOMY (Bilateral): stable  Plan: Discharge home  LOS: 2 days    [name] S 06/27/2013, 8:27 AM

## 2013-06-28 NOTE — Discharge Summary (Signed)
[name], [name] NO.:  [id number]  MEDICAL RECORD NO.:  [id number]  LOCATION:  9305                          FACILITY:  [facility]  PHYSICIAN:  Juluis Mire, M.D.   DATE OF BIRTH:  September 22, 1971  DATE OF ADMISSION:  06/25/2013 DATE OF DISCHARGE:  06/27/2013                              DISCHARGE SUMMARY   ADMITTING DIAGNOSIS:  Pelvic pain with associated endometriosis.  POSTOPERATIVE DIAGNOSIS:  Pelvic pain with associated endometriosis.  OPERATIVE PROCEDURE:  Total abdominal hysterectomy with bilateral salpingo-oophorectomy.  For complete history and physical, please see dictated note.  COURSE IN THE HOSPITAL:  The patient underwent above-noted surgery.  She had severe endometriosis with bilateral endometriomas.  Postop, she did extremely well.  Her postop hemoglobin was 10.1.  She was discharged home on her second postop day.  At that time, she was afebrile.  Stable vital signs.  Her abdomen was soft.  She had some incisional tenderness. Bowel sounds were active.  Incision was clear.  She had minimal vaginal bleeding and voiding without difficulty.  Tolerating p.o. and was ambulating.  In terms of complication, cannot stay in the hospital.  The patient discharged home in stable condition.  DISPOSITION:  Routine postop instructions were given.  She is to avoid heavy lifting, vaginal entry, or driving a car.  She is to watch for signs of infection.  Report nausea, vomiting and report excessive pain and discomfort.  Report excessive vaginal bleeding.  Instructed signs and symptoms of deep venous thrombosis and pulmonary embolus.  She will be discharged home on Dilaudid as needed for pain and Colace as a stool softener.  Office will call her tomorrow and arrange followup on Monday where her dressing will be removed.     Juluis Mire, M.D.     JSM/MEDQ  D:  06/27/2013  T:  06/28/2013  Job:  [id number]

## 2013-07-19 ENCOUNTER — Other Ambulatory Visit: Payer: Self-pay | Admitting: Family Medicine

## 2013-07-19 DIAGNOSIS — R42 Dizziness and giddiness: Secondary | ICD-10-CM

## 2013-07-23 ENCOUNTER — Other Ambulatory Visit: Payer: BC Managed Care – PPO

## 2013-07-24 ENCOUNTER — Ambulatory Visit: Payer: BC Managed Care – PPO | Admitting: Neurology

## 2013-08-02 ENCOUNTER — Other Ambulatory Visit: Payer: Self-pay

## 2013-08-20 ENCOUNTER — Ambulatory Visit (INDEPENDENT_AMBULATORY_CARE_PROVIDER_SITE_OTHER): Payer: BC Managed Care – PPO | Admitting: Neurology

## 2013-08-20 ENCOUNTER — Encounter: Payer: Self-pay | Admitting: Neurology

## 2013-08-20 VITALS — BP 124/82 | HR 98 | Ht 66.5 in | Wt 138.0 lb

## 2013-08-20 DIAGNOSIS — R55 Syncope and collapse: Secondary | ICD-10-CM

## 2013-08-20 DIAGNOSIS — R42 Dizziness and giddiness: Secondary | ICD-10-CM

## 2013-08-20 DIAGNOSIS — R51 Headache: Secondary | ICD-10-CM

## 2013-08-20 DIAGNOSIS — R110 Nausea: Secondary | ICD-10-CM

## 2013-08-20 NOTE — Patient Instructions (Addendum)
Please remember, common headache triggers are: sleep deprivation, dehydration, overheating, stress, hypoglycemia or skipping meals and blood sugar fluctuations, excessive pain medications or excessive alcohol use or caffeine withdrawal. Some people have food triggers such as aged cheese, orange juice or chocolate, especially dark chocolate, or MSG (monosodium glutamate). Try to avoid these headache triggers as much possible. It may be helpful to keep a headache diary to figure out what makes your headaches worse or brings them on and what alleviates them. Some people report headache onset after exercise but studies have shown that regular exercise may actually prevent headaches from coming. If you have exercise-induced headaches, please make sure that you drink plenty of fluid before and after exercising and that you do not over do it and do not overheat.  I think overall you are doing fairly well but I do want to suggest a few things today:  Remember to drink plenty of fluid, eat healthy meals and do not skip any meals. Try to eat protein with a every meal and eat a healthy snack such as fruit or nuts in between meals. Try to keep a regular sleep-wake schedule and try to exercise daily, particularly in the form of walking, 20-30 minutes a day, if you can.   Vasovagal syncope is one of the most common causes of fainting. Vasovagal syncope occurs when your body overreacts to certain triggers, such as the sight of blood or extreme emotional distress or overheating.  The vasovagal syncope trigger causes a sudden drop in your heart rate and blood pressure, which leads to reduced blood flow to your brain, which results in a brief loss of consciousness. Vasovagal syncope is usually harmless and requires no treatment. However, if you collapse and fall, it is possible you may injure yourself.  Pre-sycopal symptoms include (but are not limited to): Skin paleness, lightheadedness, tunnel vision, nausea, a rising  feeling of warmth, feeling cold and clammy, excess yawning, and blurry vision. You may have jerky, abnormal movements, a slow and weak pulse and dilated pupils and you may have some confusion and mental slowing when you come to.  Common triggers for vasovagal syncope include (but are not limited to): Standing for long periods of time, heat exposure, the sight of blood or having blood drawn, fear and straining, such as during a bowel movement.  There is specific medication for fainting. Sometimes we use medications to keep the blood pressure elevated but this is rare. Supportive treatment includes foot exercises, wearing compression stockings or tensing your leg muscles when standing, increasing salt in your diet (unless you have high blood pressure). Avoid prolonged standing - especially in hot, crowded places - and drink plenty of fluids and change position from sitting to standing or lying to standing slowly.    As far as your medications are concerned, I would like to suggest no change.   As far as diagnostic testing: MRI brain with and without contrast. Renee Mansir will call you to schedule.   I would like to see you back as needed. We will call you with your MRI result.

## 2013-08-20 NOTE — Progress Notes (Signed)
Subjective:    [name] ID: [name] is a 42 y.o. female.  HPI   Huston Foley, MD, PhD [facility] [address] [address] [place], [place] [address]  Dear Dr. Zachery Dauer,  I saw your [name], [name], upon your kind request in my neurologic clinic today for initial consultation of her dizziness. [name] is unaccompanied today. As you know, [name] is a very pleasant 42 year old right-handed woman with an underlying medical history of sinus disease, depression, severe endometriosis, status post recent total abdominal hysterectomy and bilateral salpingo-oophorectomy, who complains of lightheadedness and dizziness for Michelle past several months. She's currently on an oral contraceptive pill, Zyrtec as needed, and sertraline 25 mg once daily. She c/o lightheaded, near-fainting feeling, no spinning, no vertigo, but cold sweat and nause. She had two syncopes as a teenager. This lasts for 30 minutes, but Michelle nausea persists. She has been seen by cardiology and by ENT. She reports Sx started in July and she felt, that perhaps after Michelle hysterectomy, but Sx did not improve. She has been scheduled for a CTH, but has not had it yet, as she was told to take Zyrtec as a trial for fluid in her ears. She has been taking it for Michelle past 2-3 weeks, with minimal improvement in her Sx. She has been having an intermittent, once a week on average HA in R frontal area, always seems to be in Michelle same spot. She feels faint and unwell. She has not lost weight, no staring spell, no twitching, no loss of time and no recent LOC. No Hx of concussion, no FHx of epilepsy and has a very rare Hx of migraines, perhaps once a year. Michelle R frontal HA is different from her migraines, not throbbing, feels like a sharper, more superficial pain. She had blood work including Thyroid, blood sugar, anemia testing, all of which was fine. She has 2 sisters and 1 brother. Her brother and one  sister have a tendency to faint, for example, at Michelle site of blood.  Never had TIA or stroke symptoms, denying sudden onset of one sided weakness, numbness, tingling, slurring of speech or droopy face, hearing loss, tinnitus, diplopia or visual field cut or monocular loss of vision.  Her Past Medical History Is Significant For: Past Medical History  Diagnosis Date  . Abnormal uterine bleeding   . Abdominal pain   . Anemia   . Anxiety     Her Past Surgical History Is Significant For: Past Surgical History  Procedure Laterality Date  . No past surgeries    . Abdominal hysterectomy N/A 06/25/2013    Procedure: HYSTERECTOMY ABDOMINAL;  Surgeon: Juluis Mire, MD;  Location: [facility];  Service: Gynecology;  Laterality: N/A;  . Salpingoophorectomy Bilateral 06/25/2013    Procedure: SALPINGO OOPHORECTOMY;  Surgeon: Juluis Mire, MD;  Location: [facility];  Service: Gynecology;  Laterality: Bilateral;    Her Family History Is Significant For: Family History  Problem Relation Age of Onset  . Heart disease Mother   . Diabetes Mother   . Lung cancer Father   . Diabetes Sister     Her Social History Is Significant For: History   Social History  . Marital Status: Single    Spouse Name: N/A    Number of Children: N/A  . Years of Education: N/A   Social History Main Topics  . Smoking status: Never Smoker   . Smokeless tobacco: None  . Alcohol Use: Yes  Comment: rarely  . Drug Use: No  . Sexual Activity: No   Other Topics Concern  . None   Social History Narrative  . None    Her Allergies Are:  Allergies  Allergen Reactions  . Ceftin [Cefuroxime Axetil] Swelling    Face numbing, tongue swelling  . Shellfish Allergy Swelling    Face/lips goes numb  . Ciprofloxacin Hives  :   Her Current Medications Are:  Outpatient Encounter Prescriptions as of 08/20/2013  Medication Sig  . cetirizine (ZYRTEC) 10 MG tablet Take 10 mg by mouth daily.  . sertraline (ZOLOFT) 25 MG tablet  Take 1 tablet by mouth daily.  Marland Kitchen ALPRAZolam (XANAX) 0.25 MG tablet Take 1 tablet by mouth 2 (two) times daily as needed for anxiety.   . docusate sodium (COLACE) 100 MG capsule Take 1 capsule (100 mg total) by mouth 2 (two) times daily.  . meclizine (ANTIVERT) 12.5 MG tablet   . naproxen sodium (ANAPROX) 550 MG tablet Take 1 tablet by mouth every 8 (eight) hours as needed (for pain).   . ondansetron (ZOFRAN) 8 MG tablet Take 1 tablet by mouth as needed.  . Prenatal Vit-Fe Fumarate-FA (PRENATAL MULTIVITAMIN) TABS tablet Take 1 tablet by mouth daily at 12 noon.  . [DISCONTINUED] HYDROmorphone (DILAUDID) 2 MG tablet Take 1 tablet (2 mg total) by mouth every 4 (four) hours as needed for pain.    Review of Systems:  Out of a complete 14 point review of systems, all are reviewed and negative with Michelle exception of these symptoms as listed below: Review of Systems  Constitutional: Negative.   Eyes: Negative.   Respiratory: Negative.   Cardiovascular: Negative.   Gastrointestinal: Negative.   Endocrine: Negative.   Genitourinary: Negative.   Musculoskeletal: Negative.   Skin: Negative.   Allergic/Immunologic: Negative.   Neurological: Positive for dizziness and headaches (Right Frontal area).  Hematological: Negative.   Psychiatric/Behavioral: Negative.     Objective:  Neurologic Exam  Physical Exam Physical Examination:   Filed Vitals:   08/20/13 1423  BP: 124/82  Pulse: 98   General Examination: [name] is a very pleasant 42 y.o. female in no acute distress. She appears well-developed and well-nourished and very well groomed.   HEENT: Normocephalic, atraumatic, pupils are equal, round and reactive to light and accommodation. Funduscopic exam is normal with sharp disc margins noted. Extraocular tracking is good without limitation to gaze excursion or nystagmus noted. Normal smooth pursuit is noted. Hearing is grossly intact. Tympanic membranes are clear bilaterally. Face is  symmetric with normal facial animation and normal facial sensation. Speech is clear with no dysarthria noted. There is no hypophonia. There is no lip, neck/head, jaw or voice tremor. Neck is supple with full range of passive and active motion. There are no carotid bruits on auscultation. Oropharynx exam reveals: mild mouth dryness, adequate dental hygiene and no airway crowding. Mallampati is class I. Tongue protrudes centrally and palate elevates symmetrically. Tonsils are small.   Chest: Clear to auscultation without wheezing, rhonchi or crackles noted.  Heart: S1+S2+0, regular and normal without murmurs, rubs or gallops noted.   Abdomen: Soft, non-tender and non-distended with normal bowel sounds appreciated on auscultation.  Extremities: There is no pitting edema in Michelle distal lower extremities bilaterally. Pedal pulses are intact.  Skin: Warm and dry without trophic changes noted. There are no varicose veins.  Musculoskeletal: exam reveals no obvious joint deformities, tenderness or joint swelling or erythema.   Neurologically:  Mental status: [name]  is awake, alert and oriented in all 4 spheres. Her memory, attention, language and knowledge are appropriate. There is no aphasia, agnosia, apraxia or anomia. Speech is clear with normal prosody and enunciation. Thought process is linear. Mood is congruent and affect is normal.  Cranial nerves are as described above under HEENT exam. In addition, shoulder shrug is normal with equal shoulder height noted. Motor exam: Normal bulk, strength and tone is noted. There is no drift, tremor or rebound. Romberg is negative. Reflexes are 2+ throughout. Toes are downgoing bilaterally. Fine motor skills are intact with normal finger taps, normal hand movements, normal rapid alternating patting, normal foot taps and normal foot agility.  Cerebellar testing shows no dysmetria or intention tremor on finger to nose testing. Heel to shin is unremarkable  bilaterally. There is no truncal or gait ataxia.  Sensory exam is intact to light touch, pinprick, vibration, temperature sense in Michelle upper and lower extremities.  Gait, station and balance are unremarkable. No veering to one side is noted. No leaning to one side is noted. Posture is age-appropriate and stance is narrow based. No problems turning are noted. She turns en bloc. Tandem walk is unremarkable. Intact toe and heel stance is noted.               Assessment and Plan:   In summary, ALILA SOTERO is a very pleasant 42 y.o.-year old female with a history of persistent . Her physical exam is non-focal. She is doing fairly well at this time and I reassured [name] in that regard.  I had a long chat with [name] about my findings and Michelle diagnosis of syncope, vasovagal syncope, presyncopal symptoms and headaches. Because she has been experiencing a recurrent headache in Michelle same area since onset of her lightheadedness I would like to proceed with a brain MRI with and without contrast. Furthermore, I talked her about headache triggers and vasovagal syncope and its triggers. She is advised that there may not be a specific medication that would help her. I am not sure that I would do anything else for her at this time. We will call her with her MRI results. She does not have a history concerning for epilepsy or TIA. She had some blood work through [employer]. I am not sure if she had any autoimmune or inflammatory markers done which I would add next time she has blood work in your office. I did not do any blood work today, as I was not sure what all she has had tested before. At this juncture, I advised her to change positions slowly, keep very well hydrated at all times, not skipping any meals, stay well rested. I can probably see her back on an as-needed basis. She was in agreement.  Thank you very much for allowing me to participate in Michelle care of this nice [name]. If I can be of any  further assistance to you please do not hesitate to call me at [phone].  Sincerely,   Huston Foley, MD, PhD

## 2013-09-05 ENCOUNTER — Ambulatory Visit (INDEPENDENT_AMBULATORY_CARE_PROVIDER_SITE_OTHER): Payer: BC Managed Care – PPO

## 2013-09-05 DIAGNOSIS — R55 Syncope and collapse: Secondary | ICD-10-CM

## 2013-09-05 DIAGNOSIS — R110 Nausea: Secondary | ICD-10-CM

## 2013-09-05 DIAGNOSIS — R51 Headache: Secondary | ICD-10-CM

## 2013-09-05 DIAGNOSIS — R42 Dizziness and giddiness: Secondary | ICD-10-CM

## 2013-09-06 MED ORDER — GADOPENTETATE DIMEGLUMINE 469.01 MG/ML IV SOLN: 13.0000 mL | Freq: Once | INTRAVENOUS | Status: AC | PRN

## 2013-09-07 NOTE — Progress Notes (Signed)
Quick Note:  Shared MR/brain normal with patient thru VM message per Dr Teofilo Pod findings ______

## 2013-09-07 NOTE — Progress Notes (Signed)
Quick Note:  Please call and advise the patient that the recent scan we did was within normal limits. We did a brain MRI with and without contrast, which showed normal findings. In particular, there were no acute findings, such as a stroke, or mass or blood products. No further action is required on this test at this time. Please remind patient to keep any upcoming appointments or tests and to call us with any interim questions, concerns, problems or updates. Thanks,  Kaityln Kallstrom, MD, PhD   ______ 

## 2013-10-30 ENCOUNTER — Encounter: Payer: Self-pay | Admitting: Cardiology

## 2013-10-30 DIAGNOSIS — R55 Syncope and collapse: Secondary | ICD-10-CM | POA: Insufficient documentation

## 2013-10-30 DIAGNOSIS — R42 Dizziness and giddiness: Secondary | ICD-10-CM

## 2013-10-31 ENCOUNTER — Ambulatory Visit (INDEPENDENT_AMBULATORY_CARE_PROVIDER_SITE_OTHER): Payer: BC Managed Care – PPO | Admitting: Interventional Cardiology

## 2013-10-31 ENCOUNTER — Encounter: Payer: Self-pay | Admitting: Interventional Cardiology

## 2013-10-31 VITALS — BP 124/84 | HR 84 | Ht 66.5 in | Wt 145.0 lb

## 2013-10-31 DIAGNOSIS — R42 Dizziness and giddiness: Secondary | ICD-10-CM

## 2013-10-31 NOTE — Patient Instructions (Signed)
Your physician has recommended that you wear a holter monitor. Holter monitors are medical devices that record the heart's electrical activity. Doctors most often use these monitors to diagnose arrhythmias. Arrhythmias are problems with the speed or rhythm of the heartbeat. The monitor is a small, portable device. You can wear one while you do your normal daily activities. This is usually used to diagnose what is causing palpitations/syncope (passing out).

## 2013-10-31 NOTE — Progress Notes (Signed)
Patient ID: [name], female   DOB: 1970-11-22, 43 y.o.   MRN: [id number]    [place], [address] [place], [place]  [address] Phone: [phone] Fax:  [phone]  Date:  10/31/2013   ID:  [name], DOB February 29, 1971, MRN [id number]  PCP:  Gerrit Heck, MD      History of Present Illness: [name] is a 43 y.o. female who has been lightheaded and nauseated intermittently several months ago, starting in July 2014. Prior to this, she was feeling normal. She has had fibroids and ovarian cysts.  The first time she got dizzy was while driving. It lasted 30 minutes. There was tingling in her head. No room spinning. Did not have to lie down. It resolved spontaneously. No prior issues with low blood sugar. She can have multiple episodes in a day. They can last 30 minutes to 2 hours. It is worse after she eats. Some days she has no problems.  She was exercising regularly before this. She does not feel like she can exercise.  She feels foggy now. Typical episodes are while she is standing or walking. No problems with prolonged standing. First thing in the morning, when walking to the bathroom, she can have a sensation of the room moving.  After these episodes, She had a hysterectomy in 9/14, for fibroids and cysts and endometriosis.    Sx have persisted.,  She can have 2 good days and then 3-4 days with intermittent dizziness.  No syncope.  She will sweat profusely and get dizzy, which can last up to 30 minutes.  Then sx resolve and she is better.  There are iter days where the sx are intermittent throughout.  HR when she not feeling well can be up to 130.  She has been going to the gym when she feels ok.  She does well on the treadmill.    BP has been low at times when she feels bad.  It is usually well controlled.  She will check her BP sitting and standing.  The BP stays the same but her HR will increase.  She initially thought that her symptoms would  improve after hysterectomy. This has not been the case. She is quite frustrated. She has seen several different specialists including ENT, neurology and cardiology without any resolution of her symptoms. She is inquiring as to whether a monitor would be helpful.    Wt Readings from Last 3 Encounters:  10/31/13 145 lb (65.772 kg)  08/20/13 138 lb (62.596 kg)  06/25/13 139 lb (63.05 kg)     Past Medical History  Diagnosis Date  . Abnormal uterine bleeding   . Abdominal pain   . Anemia   . Anxiety   . Depression   . Frequent sinus infections     Current Outpatient Prescriptions  Medication Sig Dispense Refill  . cetirizine (ZYRTEC) 10 MG tablet Take 10 mg by mouth daily.      Marland Kitchen estradiol (VIVELLE-DOT) 0.075 MG/24HR Place 1 patch onto the skin 2 (two) times a week.       No current facility-administered medications for this visit.    Allergies:    Allergies  Allergen Reactions  . Ceftin [Cefuroxime Axetil] Swelling    Face numbing, tongue swelling  . Shellfish Allergy Swelling    Face/lips goes numb  . Ciprofloxacin Hives    Social History:  The patient  reports that she has never smoked. She does not have any smokeless tobacco  history on file. She reports that she drinks alcohol. She reports that she does not use illicit drugs.   Family History:  The patient's family history includes Diabetes in her mother and sister; Heart attack in her mother; Heart disease in her mother; Lung cancer in her father.   ROS:  Please see the history of present illness. nausea, No  vomiting.  No fevers, chills.  No focal weakness.  No dysuria. lightheadedness   All other systems reviewed and negative.   PHYSICAL EXAM: VS:  BP 124/84  Pulse 84  Ht 5' 6.5" (1.689 m)  Wt 145 lb (65.772 kg)  BMI 23.06 kg/m2 Well nourished, well developed, in no acute distress HEENT: normal Neck: no JVD, no carotid bruits Cardiac:  normal S1, S2; RRR;  Lungs:  clear to auscultation bilaterally, no wheezing,  rhonchi or rales Abd: soft, nontender, no hepatomegaly Ext: no edema Skin: warm and dry Neuro:   no focal abnormalities noted      ASSESSMENT AND PLAN:  1. Lightheadedness/dizziness: Unclear etiology. Encourage hydration. She has had a negative stress test and echocardiogram back in September of 2014. We'll plan for 24-hour Holter monitor to see what her average heart rate is and r/o persistent tachycardia, given that she feels tachycardia at rest. Would consider referral to EP as well. She may need a longer-term monitor. She has not fully passed out but she feels like she is getting closer to this point. She does have a prodrome and is able to sit down before she falls.   Dizziness seemed most like a vasovagal reaction. I stressed the importance of trying to sit down immediately when she has any symptoms. She needs to try to stay well hydrated as well. She should eat regularly. She has had similar type of symptoms when she has blood drawn. Now , she feels sx are coming on without any stress.    Signed, Mina Marble, MD, [facility] 10/31/2013 8:26 AM

## 2013-11-06 ENCOUNTER — Encounter (INDEPENDENT_AMBULATORY_CARE_PROVIDER_SITE_OTHER): Payer: BC Managed Care – PPO

## 2013-11-06 ENCOUNTER — Encounter: Payer: Self-pay | Admitting: *Deleted

## 2013-11-06 DIAGNOSIS — R42 Dizziness and giddiness: Secondary | ICD-10-CM

## 2013-11-06 NOTE — Progress Notes (Signed)
Patient ID: [name], female   DOB: 03/11/1971, 43 y.o.   MRN: [id number] E-Cardio 24 hour holter monitor applied to patient.

## 2013-11-14 ENCOUNTER — Telehealth: Payer: Self-pay | Admitting: Interventional Cardiology

## 2013-11-14 NOTE — Telephone Encounter (Signed)
Called pt and told her I just received final report and I will have Dr. Irish Lack interpret tomorrow.

## 2013-11-14 NOTE — Telephone Encounter (Signed)
New Message  Pt called for Monitor results// Please assist//SR

## 2013-11-15 NOTE — Telephone Encounter (Signed)
Pt notified of results

## 2013-11-15 NOTE — Telephone Encounter (Signed)
Dr. Hassell Done Interpretation 24 HR Holter: One 2.5 sec run of PAC's, which would not explain dizziness. Occasional PVC's. No sustained pathologic arrhythmia. Occasional "heart pounding" with PVC.

## 2013-11-15 NOTE — Telephone Encounter (Signed)
Follow up ° ° ° ° ° °Pt want monitor results °

## 2013-11-21 ENCOUNTER — Telehealth: Payer: Self-pay | Admitting: Interventional Cardiology

## 2013-11-21 DIAGNOSIS — R42 Dizziness and giddiness: Secondary | ICD-10-CM

## 2013-11-21 NOTE — Telephone Encounter (Signed)
New Problem:  Pt is requesting a call back with her heart monitor results. Pt is requesting someone else besides the person who called last. Pt states the person that called last did not explain it good enough.

## 2013-11-21 NOTE — Telephone Encounter (Signed)
To Michelle

## 2013-11-21 NOTE — Telephone Encounter (Signed)
Called patient per request from [facility], [place].  Spoke with patient who would like to discuss the monitor findings with Dr. Irish Lack as patient has her journal and recorded what she was doing every time she pressed the button on the monitor.  Patient states she continues to experience symptoms every day and would like to discuss this with Dr. Irish Lack.  I advised patient that I will forward message to Dr. Irish Lack and speak with him in person so that he can follow up with the patient.  Patient thanked me for my help.

## 2013-12-05 NOTE — Telephone Encounter (Signed)
I Explained results of monitor to the patient. All questions were answered.  I do not think her persistent dizzy feeling is coming from a cardiac source. I suggested perhaps seeing an endocrinologist to help with her persistent dizziness, to evaluate for any type of adrenal insufficiency.

## 2013-12-06 NOTE — Telephone Encounter (Signed)
LMTRC

## 2013-12-06 NOTE — Telephone Encounter (Signed)
Pt wants referral. Referral sent.

## 2013-12-13 ENCOUNTER — Ambulatory Visit: Payer: BC Managed Care – PPO | Admitting: Interventional Cardiology

## 2013-12-21 ENCOUNTER — Ambulatory Visit (INDEPENDENT_AMBULATORY_CARE_PROVIDER_SITE_OTHER): Payer: BC Managed Care – PPO | Admitting: Internal Medicine

## 2013-12-21 ENCOUNTER — Other Ambulatory Visit (INDEPENDENT_AMBULATORY_CARE_PROVIDER_SITE_OTHER): Payer: BC Managed Care – PPO | Admitting: *Deleted

## 2013-12-21 ENCOUNTER — Encounter: Payer: Self-pay | Admitting: Internal Medicine

## 2013-12-21 VITALS — BP 104/62 | HR 90 | Temp 98.6°F | Resp 12 | Ht 66.5 in | Wt 148.0 lb

## 2013-12-21 DIAGNOSIS — R42 Dizziness and giddiness: Secondary | ICD-10-CM

## 2013-12-21 DIAGNOSIS — R55 Syncope and collapse: Secondary | ICD-10-CM

## 2013-12-21 LAB — CORTISOL
Cortisol, Plasma: 26.4 ug/dL
Cortisol, Plasma: 11.7 ug/dL
Cortisol, Plasma: 21.9 ug/dL

## 2013-12-21 MED ORDER — COSYNTROPIN 0.25 MG IJ SOLR: 0.2500 mg | Freq: Once | INTRAMUSCULAR | Status: DC

## 2013-12-21 MED ORDER — COSYNTROPIN 0.25 MG IJ SOLR
0.2500 mg | Freq: Once | INTRAMUSCULAR | Status: DC
Start: 2013-12-21 — End: 2013-12-21

## 2013-12-21 MED ORDER — COSYNTROPIN 0.25 MG IJ SOLR
0.2500 mg | Freq: Once | INTRAMUSCULAR | Status: AC
  Administered 2013-12-21: 0.25 mg via INTRAMUSCULAR

## 2013-12-21 NOTE — Progress Notes (Signed)
Patient ID: [name], female   DOB: Jun 19, 1971, 43 y.o.   MRN: [id number]   HPI  [name] is a 43 y.o.-year-old female, referred by her cardiologist, Dr. Irish Lack, for evaluation for dizziness, nausea and syncope >> evaluate for adrenal insufficiency.  Pt describes that she started to be lightheaded in 03/2013. Until then >> very healthy. She was checked for anemia in 05/2013:  Lab Results  Component Value Date   WBC 14.6* 06/26/2013   HGB 10.1* 06/26/2013   HCT 30.3* 06/26/2013   MCV 89.4 06/26/2013   PLT 295 06/26/2013   She has a TAH + BSO in 05/2013, after the above labs drawn >> last CBC: 10/10/2013: Hb 13.7. Despite improvement in Hb, the lightheadedness is getting worse. Coworkers told her her face gets white during the episodes. She does not get dizzy, but gets nauseated. She has these spells 4-5x a day, lasting 10-15 min each, then feels well. No associated HA. No preceding anxiety. She gets palpitations with the episodes. No AP. No sweating, but gets hot. She chews ginger as she has nausea frequently. She cannot walk for a long time as    She was seen by Dr Irish Lack on 10/31/2013 >> I reviewed his note. She wore a Holter monitor >> she had to press a button every time she has sxs >> had PVCs every time she pressed the button, but not long lasting (only 3-4 sec), and likely not the cause of dizziness. 2D Echo was normal. No tilt table test.   She checks her pulse and BP at home: pulse increases from 60 >> 100 when stands. BP usually 120/60. No episodes of high BPs. No herbal OTC meds.   She does not have weight loss >> since hysterectomy gained 10 lbs. She is afraid to exercise b/c of presyncope.  Did not notice being more tanned.   Mother is diabetic >> pt did check her sugars during on episodes: 70-90.   Stopped all diet sodas that she was drinking up until 05/2013. She limits salt.   She saw neurology (Dr Rexene Alberts) >> MRI brain negative.   She gets vago-vagal  syncope with lab draws.   I reviewed labs that pt brought from PCP (10/10/2013): - CBC with diff normal - CMP normal - ESR 3 - Ferritin 19.2 (11-306.8); Sat 18 (20-55%), iron 66 (50-212), TIBC 367 (250-450), Transferrin 262 (203-362) - RF <13 - TSH 0.50 (0.34-4.5) - ANA neg. - MRI brain w/w/o contrast: normal, except mild paranasal sinus inflammation  ROS: Constitutional: see HPI Eyes: no blurry vision, no xerophthalmia ENT: no sore throat, nodules palpated in throat, no dysphagia/odynophagia, no hoarseness Cardiovascular: no CP/SOB/+ palpitations/no leg swelling Respiratory: no cough/SOB Gastrointestinal: + N/no V/D/C Musculoskeletal: + muscle aches /no joint aches Skin: no rashes Neurological: no tremors/numbness/tingling/dizziness, + HA Psychiatric: + both: depression/anxiety  Past Medical History  Diagnosis Date  . Abnormal uterine bleeding   . Abdominal pain   . Anemia   . Anxiety   . Depression   . Frequent sinus infections    Past Surgical History  Procedure Laterality Date  . No past surgeries    . Abdominal hysterectomy N/A 06/25/2013    Procedure: HYSTERECTOMY ABDOMINAL;  Surgeon: Darlyn Chamber, MD;  Location: [facility];  Service: Gynecology;  Laterality: N/A;  . Salpingoophorectomy Bilateral 06/25/2013    Procedure: SALPINGO OOPHORECTOMY;  Surgeon: Darlyn Chamber, MD;  Location: [facility];  Service: Gynecology;  Laterality: Bilateral;   History   Social  History  . 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 Smoking status: Former Smoker - quit in 1993  . Smokeless tobacco: Not on file  . Alcohol Use: Yes     Comment: rarely  . Drug Use: No  . Sexual Activity: No   Current Outpatient Prescriptions on File Prior to Visit  Medication Sig Dispense Refill  . estradiol (VIVELLE-DOT) 0.075 MG/24HR Place 1 patch onto the skin 2 (two) times a week.      .  cetirizine (ZYRTEC) 10 MG tablet Take 10 mg by mouth daily.       No current facility-administered medications on file prior to visit.   Allergies  Allergen Reactions  . Ceftin [Cefuroxime Axetil] Swelling    Face numbing, tongue swelling  . Shellfish Allergy Swelling    Face/lips goes numb  . Ciprofloxacin Hives   Family History  Problem Relation Age of Onset  . Heart disease Mother   . Diabetes Mother   . Heart attack Mother   . Lung cancer Father   . Diabetes Sister    PE: BP 104/62  Pulse 90  Temp(Src) 98.6 F (37 C) (Oral)  Resp 12  Ht 5' 6.5" (1.689 m)  Wt 148 lb (67.132 kg)  BMI 23.53 kg/m2  SpO2 97%  LMP 06/08/2013 Wt Readings from Last 3 Encounters:  12/21/13 148 lb (67.132 kg)  10/31/13 145 lb (65.772 kg)  08/20/13 138 lb (62.596 kg)   Constitutional: normal weight, in NAD Eyes: PERRLA, EOMI, no exophthalmos ENT: moist mucous membranes, no thyromegaly, no cervical lymphadenopathy Cardiovascular: RRR, No MRG Respiratory: CTA B Gastrointestinal: abdomen soft, NT, ND, BS+ Musculoskeletal: no deformities, strength intact in all 4 Skin: moist, warm, no rashes, no darkening of buccal mucosa, no increased tanning Neurological: no tremor with outstretched hands, DTR normal in all 4  ASSESSMENT: 1. Lightheadedness/Nausea/Syncope  PLAN:  1. ? adrenal insufficiency Pt with sxs that can be attributed to Adrenal insufficiency: nausea, lightheadedness, near-syncope, HA. Of note, no weight loss, darkening of skin. No apparent exogenous cause.  - will check a cosyntropin stimulation test today, as the appt is first of the day - if this is normal, probably a tilt-table test would be the next investigation - She is on estradiol HRT, but this is a patch, so unlikely to influence Cortisol levels - I advise her not to stay away from salt - I will see the pt back if results are abnormal.  Office Visit on 12/21/2013  Component Date Value Ref Range Status  . Cortisol,  Plasma 12/21/2013 11.7   Final   AM:  4.3 - 22.4 ug/dLPM:  3.1 - 16.7 ug/dL  . Cortisol, Plasma 12/21/2013 21.9   Final   AM:  4.3 - 22.4 ug/dLPM:  3.1 - 16.7 ug/dL  . Cortisol, Plasma 12/21/2013 26.4   Final   AM:  4.3 - 22.4 ug/dLPM:  3.1 - 16.7 ug/dL   Stim test is negative for adrenal insufficiency. Probably next step is a tilt test. ACTH still pending, but not likely to change dx.  12/24/2013 ACTH normal, at 12.

## 2013-12-21 NOTE — Patient Instructions (Signed)
Please stop at the lab for the cosyntropin stimulation test. Will schedule a new appointment if this test is abnormal. If this is negative, would continue with a tilt table test.

## 2013-12-24 LAB — ACTH: C206 ACTH: 12 pg/mL (ref 6–50)

## 2014-02-11 ENCOUNTER — Institutional Professional Consult (permissible substitution): Payer: BC Managed Care – PPO | Admitting: Internal Medicine

## 2014-02-12 ENCOUNTER — Ambulatory Visit (INDEPENDENT_AMBULATORY_CARE_PROVIDER_SITE_OTHER): Payer: BC Managed Care – PPO | Admitting: Internal Medicine

## 2014-02-12 ENCOUNTER — Encounter: Payer: Self-pay | Admitting: Internal Medicine

## 2014-02-12 VITALS — BP 124/84 | HR 97 | Ht 66.5 in | Wt 143.5 lb

## 2014-02-12 DIAGNOSIS — R110 Nausea: Secondary | ICD-10-CM

## 2014-02-12 DIAGNOSIS — R42 Dizziness and giddiness: Secondary | ICD-10-CM

## 2014-02-12 DIAGNOSIS — I493 Ventricular premature depolarization: Secondary | ICD-10-CM

## 2014-02-12 NOTE — Patient Instructions (Addendum)
Your physician recommends that you continue on your current medications as directed. Please refer to the Current Medication list given to you today.  Increase your fluid intake  Start wearing an abdominal binder  [email or web address] ([place])   Your physician recommends that you schedule a follow-up appointment in: 3 weeks with Dr. Caryl Comes.

## 2014-02-12 NOTE — Progress Notes (Signed)
ELECTROPHYSIOLOGY CONSULT NOTE  Patient ID: [name], MRN: [id number], DOB/AGE: 1971-08-26 43 y.o. Admit date: (Not on file) Date of Consult: 02/12/2014  Primary Physician: [name], MD Primary Cardiologist: JV  Chief Complaint:  Syncope NV   HPI [name] is a 43 y.o. female  Seen at the request of Dr.Barnes/JV  Because of spells of lightheadedness and nausea.  She has hx of remote syncope. These episodes occurred as a girl and were associated with a stereotypical prodrome   she was then well until  July 2014. The first episode occurred while she was at a water park. Nausea and lightheadedness that abated with rest.  She then had infrequent episodes during the summer, typically lasting about 30 minutes. She was described these episodes as pale. Increasing fluid intake was recommended as is increasing salt. This seemed to have low impact.  She underwent an abdominal hysterectomy and oophorectomy as well as procedures involving her endometriosis. Since that time her symptoms have been significantly more persistent. 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  Her symptoms are largely relieved by sitting or prolonged standing is associated with dizziness and nausea. She has had no syncope. She was referred to [facility] where she underwent an evaluation by neurology this was negative. She also significant for tilt table testing. She has sinus tachycardia at the beginning there is no significant changes in blood pressure although there was a gradual from the 120s--99. Upon recumbency her heart rate normalized.  She has tried thigh-high support stockings without benefit.  She has previously been quite fit  but is unable to exercise.  Does not use caffeine or recreational drugs.  Cardiac evaluation has included a normal echo and normal stress test  Holter monitor 2/15 demonstrated heart rate excursion 42--139 mean rate of 80s mean rates in the early morning hours  for over 100.  She is also noted to have PVCs. This comprised  1.5% and apparently when she saw Dr. Haynes Kerns she had orthostatic bigeminy.    Blood work was reviewed. Her TSH is a little bit low. She is a history of anxiety and depression but this is not an active issue      Past Medical History  Diagnosis Date  . Abnormal uterine bleeding   . Abdominal pain   . Anemia   . Anxiety   . Depression   . Frequent sinus infections   . Peripheral neuropathy   . Sleep disturbance, unspecified   . Pain in joint, multiple sites   . Fatigue       Surgical History:  Past Surgical History  Procedure Laterality Date  . No past surgeries    . Abdominal hysterectomy N/A 06/25/2013    Procedure: HYSTERECTOMY ABDOMINAL;  Surgeon: Darlyn Chamber, MD;  Location: [facility];  Service: Gynecology;  Laterality: N/A;  . Salpingoophorectomy Bilateral 06/25/2013    Procedure: SALPINGO OOPHORECTOMY;  Surgeon: Darlyn Chamber, MD;  Location: [facility];  Service: Gynecology;  Laterality: Bilateral;     Home Meds: Prior to Admission medications   Medication Sig Start Date End Date Taking? Authorizing Provider  estradiol (VIVELLE-DOT) 0.075 MG/24HR Place 1 patch onto the skin 2 (two) times a week.   Yes Historical Provider, MD      AllergiesShe was also notAllergies  Allergen Reactions  . Ceftin [Cefuroxime Axetil] Swelling    Face numbing, tongue swelling  . Shellfish Allergy Swelling    Face/lips goes numb  . Ciprofloxacin Hives  History   Social History  . Marital Status: Single    Spouse Name: N/A    Number of Children: N/A  . Years of Education: N/A   Occupational History  . Not on file.   Social History Main Topics  . Smoking status: Never Smoker   . Smokeless tobacco: Not on file  . Alcohol Use: Yes     Comment: rarely  . Drug Use: No  . Sexual Activity: No   Other Topics Concern  . Not on file   Social History Narrative  . No narrative on file     Family History  Problem  Relation Age of Onset  . Heart disease Mother   . Diabetes Mother   . Heart attack Mother   . Lung cancer Father   . Diabetes Sister   . CAD Mother   . Lung cancer Father      ROS:  Please see the history of present illness.     All other systems reviewed and negative.    Physical Exam:   Blood pressure 124/84, pulse 97, height 5' 6.5" (1.689 m), weight 143 lb 8 oz (65.091 kg), last menstrual period 06/08/2013. General: Well developed, well nourished female in no acute distress. Head: Normocephalic, atraumatic, sclera non-icteric, no xanthomas, nares are without discharge. EENT: normal Lymph Nodes:  none Back: without scoliosis/kyphosis* , no CVA tendersness Neck: Negative for carotid bruits. JVD not elevated. Lungs: Clear bilaterally to auscultation without wheezes, rales, or rhonchi. Breathing is unlabored. Heart: RRR with S1 S2. No murmur , rubs, or gallops appreciated. Abdomen: Soft, non-tender, non-distended with normoactive bowel sounds. No hepatomegaly. No rebound/guarding. No obvious abdominal masses. Msk:  Strength and tone appear normal for age. Extremities: No clubbing or cyanosis. No edema.  Distal pedal pulses are 2+ and equal bilaterally. Skin: Warm and Dry Neuro: Alert and oriented X 3. CN III-XII intact Grossly normal sensory and motor function . Psych:  Responds to questions appropriately with a normal affect.      Labs: Cardiac Enzymes No results found for this basename: CKTOTAL, CKMB, TROPONINI,  in the last 72 hours CBC Lab Results  Component Value Date   WBC 14.6* 06/26/2013   HGB 10.1* 06/26/2013   HCT 30.3* 06/26/2013   MCV 89.4 06/26/2013   PLT 295 06/26/2013   PROTIME: No results found for this basename: LABPROT, INR,  in the last 72 hours Chemistry No results found for this basename: NA, K, CL, CO2, BUN, CREATININE, CALCIUM, LABALBU, PROT, BILITOT, ALKPHOS, ALT, AST, GLUCOSE,  in the last 168 hours Lipids No results found for this basename: CHOL,  HDL, LDLCALC, TRIG   BNP No results found for this basename: probnp   Miscellaneous Lab Results  Component Value Date   DDIMER 0.22 04/09/2012    Radiology/Studies:  No results found.  EKG:  Sinus rhythm at 55 Intervals 14/0 39 RSR in otherwise normal    Assessment and Plan:  Lightheadedness and nausea  Orthostatic intolerance  The symptom complex is suggestive of a dysautonomic syndrome given its orthostatic trigger and relief of orthostatic relief.  She has been using Zofran for nausea with some relief. She has  undertaken a variety of unsuccessful efforts to mitigate orthostatic intolerance including pressure support stockings salt and water intake.  The abrupt onset suggested there may be a post viral trigger although she does not remember a specific event.  Her blood pressure certainly sufficient that we would allow [place] to try a beta blocker I think  for now I would like to try our further nonmedicinal approaches including an abdominal binder, increasing fluid intake and efforts to regain some degree of exercise. Also have her shower at night instead of in the morning. We will see her in a few weeks.     Deboraha Sprang

## 2014-02-28 ENCOUNTER — Encounter: Payer: Self-pay | Admitting: Internal Medicine

## 2014-03-06 ENCOUNTER — Ambulatory Visit (INDEPENDENT_AMBULATORY_CARE_PROVIDER_SITE_OTHER): Payer: BC Managed Care – PPO | Admitting: Internal Medicine

## 2014-03-06 VITALS — BP 139/74 | HR 94 | Ht 66.0 in | Wt 144.0 lb

## 2014-03-06 DIAGNOSIS — I493 Ventricular premature depolarization: Secondary | ICD-10-CM

## 2014-03-06 DIAGNOSIS — I4949 Other premature depolarization: Secondary | ICD-10-CM

## 2014-03-06 DIAGNOSIS — R42 Dizziness and giddiness: Secondary | ICD-10-CM

## 2014-03-06 NOTE — Patient Instructions (Signed)
Prescriptions were given to you today to try --- DO NOT TAKE THESE MEDICATIONS AT THE SAME TIME. TRY ONE MEDICATION AT A TIME. 1) Atenolol 25 mg daily for 2 weeks -- if no improvement, stop and start #2 2) Metoprolol Succinate 25 mg for 2 weeks -- if no improvement, stop and start #3 3) Metoprolol Tartrate 25 mg twice daily for 2 weeks    Please call us once you have found the medication that works for you.  Your physician has recommended that you wear a holter monitor. Holter monitors are medical devices that record the heart's electrical activity. Doctors most often use these monitors to diagnose arrhythmias. Arrhythmias are problems with the speed or rhythm of the heartbeat. The monitor is a small, portable device. You can wear one while you do your normal daily activities. This is usually used to diagnose what is causing palpitations/syncope (passing out).  Your physician recommends that you schedule a follow-up appointment in: 12 weeks with Dr. Caryl Comes.

## 2014-03-06 NOTE — Progress Notes (Signed)
      Patient Care Team: [name], MD as PCP - General (Internal Medicine)   HPI  [name] is a 43 y.o. female Seen in followup for orthostatic intolerance with symptoms suggestive of at this autonomic syndrome. Was thought potentially be post viral given the relatively abrupt onset of her symptoms. No medicinal approaches were suggested including an abdominal binder fluid intake and exercise.  The abdominal binder was associated with palpitations and a fullness in her neck. He did not seem to attenuate her symptom she went  She went to see Dr. Joylene Draft. He did orthostatics and noted that she developed significant PVCs while standing and that this was associated with a marked worsening in her symptoms  She continues to work on fluid and salt repletion.  Past Medical History  Diagnosis Date  . Abnormal uterine bleeding   . Abdominal pain   . Anemia   . Anxiety   . Depression   . Frequent sinus infections   . Peripheral neuropathy   . Sleep disturbance, unspecified   . Pain in joint, multiple sites   . Fatigue     Past Surgical History  Procedure Laterality Date  . No past surgeries    . Abdominal hysterectomy N/A 06/25/2013    Procedure: HYSTERECTOMY ABDOMINAL;  Surgeon: Darlyn Chamber, MD;  Location: [facility];  Service: Gynecology;  Laterality: N/A;  . Salpingoophorectomy Bilateral 06/25/2013    Procedure: SALPINGO OOPHORECTOMY;  Surgeon: Darlyn Chamber, MD;  Location: [facility];  Service: Gynecology;  Laterality: Bilateral;    Current Outpatient Prescriptions  Medication Sig Dispense Refill  . estradiol (CLIMARA - DOSED IN MG/24 HR) 0.1 mg/24hr patch Place 0.1 mg onto the skin once a week.       No current facility-administered medications for this visit.    Allergies  Allergen Reactions  . Ceftin [Cefuroxime Axetil] Swelling    Face numbing, tongue swelling  . Shellfish Allergy Swelling    Face/lips goes numb  . Ciprofloxacin Hives    Review of Systems  negative except from HPI and PMH  Physical Exam BP 139/74  Pulse 94  Ht 5\' 6"  (1.676 m)  Wt 144 lb (65.318 kg)  BMI 23.25 kg/m2  LMP 06/08/2013 Well developed and nourished in no acute distress HENT normal Neck supple with JVP-flat Clear Regular rate and rhythm, no murmurs or gallops Abd-soft with active BS No Clubbing cyanosis edema Skin-warm and dry A & Oriented  Grossly normal sensory and motor function  Orthostatic tracings were reviewed from Dr. Haynes Kerns  it was noted that she had PVCs with RVOT morphology i.e. left bundle inferior axis   Assessment and  Plan  Orthostatic intolerance  Nausea   PVCs upon standing -left bundle inferior axis  She continues to symptoms of orthostatic intolerance not withstanding   relatively few objective measures of back while here in the office. Interestingly, Dr. Haynes Kerns is orthostatic ECG tracings show the development of PVCs with RVOT morphology. These are adrenergically sensitive.  It begs the question as to whether her symptoms are largely related to ventricular ectopy. To this end we'll get a 48 hour Holter. We'll also begin her on low-dose beta-blockade. We have reviewed side effects. We will regroup in about 12 weeks.  I will also anticipate discussing her situation with Dr. Mare Ferrari at [facility] to see if she could offer insights. She will continue Zofran for nausea.

## 2014-03-13 ENCOUNTER — Telehealth: Payer: Self-pay | Admitting: Internal Medicine

## 2014-03-13 NOTE — Telephone Encounter (Signed)
New message     Pt is on atenolol---bp is low.  Can she cut the medication in half?

## 2014-03-14 NOTE — Telephone Encounter (Signed)
Pt BP dropping as low as 80/40. Discussed this with Dr. Caryl Comes -- advised pt to stop Atenolol and start Metoprolol succinate.  Pt is agreeable to this and will call back if reoccurs.

## 2014-03-18 ENCOUNTER — Encounter: Payer: Self-pay | Admitting: *Deleted

## 2014-03-18 ENCOUNTER — Encounter (INDEPENDENT_AMBULATORY_CARE_PROVIDER_SITE_OTHER): Payer: BC Managed Care – PPO

## 2014-03-18 DIAGNOSIS — I4949 Other premature depolarization: Secondary | ICD-10-CM

## 2014-03-18 DIAGNOSIS — I493 Ventricular premature depolarization: Secondary | ICD-10-CM

## 2014-03-18 NOTE — Progress Notes (Signed)
Patient ID: [name], female   DOB: 1971/05/31, 43 y.o.   MRN: [id number] [facility] 48 hour holter monitor applied to patient.

## 2014-03-26 ENCOUNTER — Other Ambulatory Visit: Payer: Self-pay | Admitting: *Deleted

## 2014-03-26 MED ORDER — METOPROLOL SUCCINATE ER 25 MG PO TB24: 25.0000 mg | ORAL_TABLET | Freq: Every day | ORAL | Status: DC

## 2014-04-01 ENCOUNTER — Telehealth: Payer: Self-pay | Admitting: *Deleted

## 2014-04-01 NOTE — Telephone Encounter (Signed)
Discussed holter monitor results with patient. Dr. Caryl Comes reports relatively infrequent PVCs and wanted to know if pt taking beta blocker. Pt tells me that she is currently taking Toprol 25mg  and states she feels better than she did. She says that every now and then she can still feel funny heart beat, but overall somewhat improved. Instructed to call us if issues arise before next OV, she is agreeable to this.

## 2014-05-21 ENCOUNTER — Telehealth: Payer: Self-pay | Admitting: Internal Medicine

## 2014-05-21 NOTE — Telephone Encounter (Signed)
New message     Talk to a nurse regarding medication

## 2014-05-23 ENCOUNTER — Encounter: Payer: Self-pay | Admitting: Internal Medicine

## 2014-05-23 ENCOUNTER — Ambulatory Visit (INDEPENDENT_AMBULATORY_CARE_PROVIDER_SITE_OTHER): Payer: BC Managed Care – PPO | Admitting: Internal Medicine

## 2014-05-23 VITALS — BP 128/81 | HR 84 | Ht 66.5 in | Wt 144.4 lb

## 2014-05-23 DIAGNOSIS — R42 Dizziness and giddiness: Secondary | ICD-10-CM

## 2014-05-23 MED ORDER — FLECAINIDE ACETATE 50 MG PO TABS: 25.0000 mg | ORAL_TABLET | Freq: Two times a day (BID) | ORAL | Status: DC

## 2014-05-23 NOTE — Telephone Encounter (Signed)
C/o light-headedness all the time.  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 Pt scheduled to see Dr. Caryl Comes this afternoon. She is very grateful for this and relieved to be able to address this today.

## 2014-05-23 NOTE — Patient Instructions (Signed)
Your physician has recommended you make the following change in your medication:  1) START Flecainide 50 mg twice daily  Your physician has requested that you have an exercise tolerance test in 3 weeks. For further information please visit [email or web address]. Please also follow instruction sheet, as given.  (Preferably on a day that Dr. Caryl Comes is in the office)

## 2014-05-23 NOTE — Progress Notes (Signed)
Patient Care Team: [name], MD as PCP - General (Internal Medicine)   HPI  [name] is a 43 y.o. female Seen in followup for orthostatic intolerance with symptoms suggestive of at this autonomic syndrome. Was thought potentially be post viral given the relatively abrupt onset of her symptoms. No medicinal approaches were suggested including an abdominal binder fluid intake and exercise.  The abdominal binder was associated with palpitations and a fullness in her neck. It  did not seem to attenuate her symptom she went  She went to see Dr. Joylene Draft. He did orthostatics and noted that she developed significant PVCs while standing and that this was associated with a marked worsening in her symptoms We were not able to reproduce this. She wore a 24 Holter monitor that at 464 PVCs and some PACs and some atrial runs.  She was started on beta blocker and had initially a significant benefit  but this was then lost the context of low blood pressure which prompted her to discontinue the atenolol. She then tried metoprolol succinate which also helped for a while.her blood pressures improved somewhat surprisingly she continued to have problems with orthostatic dizziness. She has not been aware of palpitations at the same juncture.   She continues to work on fluid and salt repletion.  Past Medical History  Diagnosis Date  . Abnormal uterine bleeding   . Abdominal pain   . Anemia   . Anxiety   . Depression   . Frequent sinus infections   . Peripheral neuropathy   . Sleep disturbance, unspecified   . Pain in joint, multiple sites   . Fatigue     Past Surgical History  Procedure Laterality Date  . No past surgeries    . Abdominal hysterectomy N/A 06/25/2013    Procedure: HYSTERECTOMY ABDOMINAL;  Surgeon: Darlyn Chamber, MD;  Location: [facility];  Service: Gynecology;  Laterality: N/A;  . Salpingoophorectomy Bilateral 06/25/2013    Procedure: SALPINGO OOPHORECTOMY;  Surgeon:  Darlyn Chamber, MD;  Location: [place] [facility];  Service: Gynecology;  Laterality: Bilateral;    Current Outpatient Prescriptions  Medication Sig Dispense Refill  . estradiol (CLIMARA - DOSED IN MG/24 HR) 0.1 mg/24hr patch Place 0.1 mg onto the skin once a week.      . metoprolol succinate (TOPROL-XL) 25 MG 24 hr tablet Take 1 tablet (25 mg total) by mouth daily.  30 tablet  3   No current facility-administered medications for this visit.    Allergies  Allergen Reactions  . Ceftin [Cefuroxime Axetil] Swelling    Face numbing, tongue swelling  . Shellfish Allergy Swelling    Face/lips goes numb  . Ciprofloxacin Hives    Review of Systems negative except from HPI and PMH  Physical Exam BP 128/81  Pulse 84  Ht 5' 6.5" (1.689 m)  Wt 144 lb 6.4 oz (65.499 kg)  BMI 22.96 kg/m2  LMP 06/08/2013 Well developed and nourished in no acute distress HENT normal Neck supple with JVP-flat Clear Regular rate and rhythm, no murmurs or gallops Abd-soft with active BS No Clubbing cyanosis edema Skin-warm and dry A & Oriented  Grossly normal sensory and motor function  Orthostatic tracings were reviewed from Dr. Haynes Kerns  it was noted that she had PVCs with RVOT morphology i.e. left bundle inferior axis  Repeat orthostatics today demonstrated a significant increase in ventricular ectopy. Both RVOT PVCs were noted as were right bundle inferior axis beats.   Assessment and  Plan  Orthostatic intolerance  Nausea   PVCs upon standing -left bundle inferior axis   She continues to orthostatic intolerance which again appears, to validate Dr. Joylene Draft hypothesis, it is related to induction of ventricular ectopy with standing presumably related to adrenergic surge.  We will plan to stop the beta blocker and try her her on low-dose flecainide therapy, this 1C agent chosen arbitrarily over propafenone. We have discussed proarrhythmic risk. Will anticipate treadmill testing in 3 weeks or so if she is able to  tolerate the medication and if it is helpful. Otherwise we'll change her over to propafenone. For now, given the hypotension with atenolol, we will avoid beta blockade. The event that she is unable to tolerate his medications consider discussions regarding catheter ablation

## 2014-05-24 ENCOUNTER — Other Ambulatory Visit: Payer: Self-pay | Admitting: *Deleted

## 2014-05-24 DIAGNOSIS — I493 Ventricular premature depolarization: Secondary | ICD-10-CM

## 2014-05-29 ENCOUNTER — Telehealth: Payer: Self-pay | Admitting: Internal Medicine

## 2014-05-29 NOTE — Telephone Encounter (Signed)
°  Patient got a call yesterday from Dr Caryl Comes regarding medication and how its working. She would like a call back.

## 2014-05-30 ENCOUNTER — Ambulatory Visit: Payer: BC Managed Care – PPO | Admitting: Internal Medicine

## 2014-05-31 NOTE — Telephone Encounter (Signed)
Patient tells me that she is taking her Flecainide at 6:30 am and 6:30 pm. Around 1 or 2 o'clock every afternoon she had spell of light-headedness but it is milder that before. She has not had any nausea since she took her first pill. I will pass information along to Dr. Caryl Comes.

## 2014-05-31 NOTE — Telephone Encounter (Signed)
Follow up      Talk to Sherri.  She states she was to call Sherri back and give an update

## 2014-06-10 ENCOUNTER — Other Ambulatory Visit: Payer: Self-pay | Admitting: Obstetrics and Gynecology

## 2014-06-11 LAB — CYTOLOGY - PAP

## 2014-06-19 ENCOUNTER — Ambulatory Visit (INDEPENDENT_AMBULATORY_CARE_PROVIDER_SITE_OTHER): Payer: BC Managed Care – PPO

## 2014-06-19 DIAGNOSIS — I4949 Other premature depolarization: Secondary | ICD-10-CM

## 2014-06-19 DIAGNOSIS — I493 Ventricular premature depolarization: Secondary | ICD-10-CM

## 2014-06-19 NOTE — Progress Notes (Signed)
Exercise Treadmill Test  Pre-Exercise Testing Evaluation Rhythm: sinus tachycardia  Rate: 93 bpm     Test  Exercise Tolerance Test Ordering MD: Virl Axe, MD  Interpreting MD: Melina Copa, PA-C  Unique Test No: 1  Treadmill:  1  Indication for ETT: PVC's  Contraindication to ETT: No   Stress Modality: exercise - treadmill  Cardiac Imaging Performed: non   Protocol: standard Bruce - maximal  Max BP:  163/70  Max MPHR (bpm):  179 85% MPR (bpm):  152  MPHR obtained (bpm):  173 % MPHR obtained:  96  Reached 85% MPHR (min:sec):  6:05 Total Exercise Time (min-sec):  9:00  Workload in METS:  10.1 Borg Scale: 19  Reason ETT Terminated:  dyspnea    ST Segment Analysis At Rest: normal ST segments - no evidence of significant ST depression With Exercise: no evidence of significant ST depression  Other Information Arrhythmia:  No Angina during ETT:  absent (0) Quality of ETT:  diagnostic  ETT Interpretation:  normal - no evidence of ischemia by ST analysis  Comments: Rare PVC upon standing.  Rare PVC in the beginning of exercise induction.  During exercise: No significant arrhythmias or PVCs during higher level or peak exercise. Dyspnea at end of test prompting discontinuation (she did not feel comfortable attempting stage IV speed), but overall good exercise tolerance. She felt increased chest awareness at end of exercise but no overt pain. No acute EKG changes. VSS. One couplet in recovery - otherwise no significant arrhythmias or ST/T changes.   Recommendations: Normal ETT on flecainide. No evidence of ischemia or significant arrhythmia by EKG. F/u Dr. Caryl Comes as scheduled.  Dayna Dunn PA-C

## 2014-07-17 ENCOUNTER — Ambulatory Visit (INDEPENDENT_AMBULATORY_CARE_PROVIDER_SITE_OTHER): Payer: BC Managed Care – PPO | Admitting: Family Medicine

## 2014-07-17 ENCOUNTER — Ambulatory Visit (INDEPENDENT_AMBULATORY_CARE_PROVIDER_SITE_OTHER): Payer: BC Managed Care – PPO

## 2014-07-17 DIAGNOSIS — M62838 Other muscle spasm: Secondary | ICD-10-CM

## 2014-07-17 DIAGNOSIS — R079 Chest pain, unspecified: Secondary | ICD-10-CM

## 2014-07-17 MED ORDER — PREDNISONE 20 MG PO TABS: 40.0000 mg | ORAL_TABLET | Freq: Every day | ORAL | Status: DC

## 2014-07-17 MED ORDER — HYDROCODONE-ACETAMINOPHEN 5-325 MG PO TABS: 1.0000 | ORAL_TABLET | Freq: Four times a day (QID) | ORAL | Status: DC | PRN

## 2014-07-17 NOTE — Progress Notes (Signed)
Subjective:    Patient ID: [name], female    DOB: 1971/05/07, 43 y.o.   MRN: [id number]  Michelle Ly, MD  Chief Complaint  Patient presents with  . Chest Pain   Patient Active Problem List   Diagnosis Date Noted  . Episodic lightheadedness 12/21/2013  . Syncope and collapse 10/30/2013  . Endometriosis, severe 06/25/2013    Class: Hospitalized for  . S/P total abdominal hysterectomy and bilateral salpingo-oophorectomy 06/25/2013    Class: Status post  . Ovarian cyst 05/15/2013  . Fibroid 05/15/2013   Prior to Admission medications   Medication Sig Start Date End Date Taking? Authorizing Provider  estradiol (CLIMARA - DOSED IN MG/24 HR) 0.1 mg/24hr patch Place 0.1 mg onto the skin once a week.    Historical Provider, MD  flecainide (TAMBOCOR) 50 MG tablet Take 0.5 tablets (25 mg total) by mouth 2 (two) times daily. 05/23/14   Deboraha Sprang, MD  metoprolol succinate (TOPROL-XL) 25 MG 24 hr tablet Take 1 tablet (25 mg total) by mouth daily. 03/26/14   Deboraha Sprang, MD   Medications, allergies, past medical history, surgical history, family history, social history and problem list reviewed and updated.  Chest Pain   43 yof presents today with 10 hours of right sided CP. Initially started soon after she awoke, on right side above right breast, within several minutes it radiated to below her right armpit. From the right armpit the pain feels as if it moves through her body to just below her right scapula. It has been constant since this AM and rated 9/10. Described as sharp. Not relieved with flexeril or ibuprofen at home. No relief with tums. Pain is worse when she lies flat and worse with deep breaths. Worse with certain movements. She is not SOB but is having increased CP with breaths. She denies syncope, presyncope, diaphoresis, or vomiting.   She cannot recall any activities or new movements she did recently that may have irritated the area. She has a family history  of CAD with her mother having 2 MIs in her 22s, otherwise no risk factors for CAD.   She had this exact same pain 2 years ago, came here, and the pain eventually resolved with muscle relaxants and was attributed to muscle spasm.   She denies any cancer history, smoking history, recent travel or immobilization, calf pain, cough, or hemoptysis. She does use an estrogen patch.   Review of Systems  Cardiovascular: Positive for chest pain.   No fever, chills. See HPI.     Objective:   Physical Exam  Constitutional: She is oriented to person, place, and time. She appears well-developed and well-nourished.  VS: Temp 98.6. HR 99. RR 18. BP 136/86. O2 sat 99%.   Cardiovascular: Normal rate, regular rhythm, S1 normal, S2 normal and normal heart sounds.  PMI is not displaced.  Exam reveals no gallop and no friction rub.   No murmur heard. Pulmonary/Chest: Effort normal and breath sounds normal. She has no decreased breath sounds. She has no wheezes. She has no rhonchi. She has no rales. She exhibits tenderness.  TTP over right chest, right side under axillary area, and along edge of right scapula. Muscle spasms noted along medial and inferior border right scapula.   Musculoskeletal:       Right lower leg: Normal. She exhibits no swelling and no edema.       Left lower leg: Normal. She exhibits no swelling and no edema.  Neurological: She  is alert and oriented to person, place, and time.  Skin: Skin is warm and dry.  Psychiatric: Her speech is normal and behavior is normal. Her mood appears anxious.   [facility] reading (PRIMARY) by  Dr. Laney [name]. Findings: No acute abnormality.  EKG: NSR with no ST/T wave changes suggestive of ischemia or infarction.      Assessment & Plan:   43 yof with no significant PMH presents for CP.  Chest pain, unspecified chest pain type - Plan: EKG 12-Lead, DG Chest 2 View --EKG, CXR, PE, vitals, and history leave low suspicion for MI, PE, or aortic dissection.    --Most likely secondary to muscle spasms along right scapula as pain is worsened with deep breath, movement, and she is TTP over area.   Muscle spasm - Plan: predniSONE (DELTASONE) 20 MG tablet, HYDROcodone-acetaminophen (NORCO) 5-325 MG per tablet --Prednisone 40 mg for three days. --Flexeril from PCP as prescribed as needed --Norco for pain as needed --RTC or to PCP tomorrow if pain has not lessened.   Michelle Gutting, PA-C Physician Assistant-Certified [facility]  07/17/2014 7:29 PM  Reviewed at bedside Michelle Haber, MD

## 2014-07-17 NOTE — Patient Instructions (Signed)
Your CP is not concerning for heart attack, pulmonary embolism, or aortic dissection based on your physical exam, EKG, and XRay.  You most likely have a muscle strain causing you this pain. Please take the flexeril muscle relaxants as needed that your PCP prescribed.  We have prescribed norco for the pain. Take one every six hours as needed for the pain. Take the prednisone 2 tablets per day for 3 days. This is an anti-inflammatory steroid.  If you're not better by tomorrow, please return to the clinic or your primary care provider.

## 2014-07-25 ENCOUNTER — Ambulatory Visit (INDEPENDENT_AMBULATORY_CARE_PROVIDER_SITE_OTHER): Payer: BC Managed Care – PPO | Admitting: Internal Medicine

## 2014-07-25 ENCOUNTER — Encounter: Payer: Self-pay | Admitting: Internal Medicine

## 2014-07-25 VITALS — BP 117/78 | HR 82 | Ht 66.5 in | Wt 148.4 lb

## 2014-07-25 DIAGNOSIS — I493 Ventricular premature depolarization: Secondary | ICD-10-CM

## 2014-07-25 DIAGNOSIS — R079 Chest pain, unspecified: Secondary | ICD-10-CM

## 2014-07-25 LAB — D-DIMER, QUANTITATIVE (NOT AT ARMC): D-Dimer, Quant: 0.27 ug/mL-FEU (ref 0.00–0.48)

## 2014-07-25 NOTE — Progress Notes (Signed)
Patient Care Team: [name], MD as PCP - General (Internal Medicine)   HPI  [name] is a 43 y.o. female Seen in followup for orthostatic intolerance with symptoms suggestive of at this autonomic syndrome. Was thought potentially be post viral given the relatively abrupt onset of her symptoms. No medicinal approaches were suggested including an abdominal binder fluid intake and exercise.  The abdominal binder was associated with palpitations and a fullness in her neck. It  did not seem to attenuate her symptom;  ultimately Dr. Joylene Draft had to proved was a second time that he made the diagnosis of orthostatic PVCs. At that point we started her on flecainide   She is vastly improved. She has occasional lightheaded spells that are very brief. Nausea is also largely gone  She continues to work on fluid and salt repletion.  Last week she had the abrupt onset of pleuritic chest pain. It was thought after evaluation to be musculoskeletal. A d-dimer was not obtained. She is on hormonal therapy. She does not have a family history of blood clots. There has been no asymmetric edema in no long traveling  Past Medical History  Diagnosis Date  . Abnormal uterine bleeding   . Abdominal pain   . Anemia   . Anxiety   . Depression   . Frequent sinus infections   . Peripheral neuropathy   . Sleep disturbance, unspecified   . Pain in joint, multiple sites   . Fatigue     Past Surgical History  Procedure Laterality Date  . No past surgeries    . Abdominal hysterectomy N/A 06/25/2013    Procedure: HYSTERECTOMY ABDOMINAL;  Surgeon: Darlyn Chamber, MD;  Location: [facility];  Service: Gynecology;  Laterality: N/A;  . Salpingoophorectomy Bilateral 06/25/2013    Procedure: SALPINGO OOPHORECTOMY;  Surgeon: Darlyn Chamber, MD;  Location: [place] [facility];  Service: Gynecology;  Laterality: Bilateral;    Current Outpatient Prescriptions  Medication Sig Dispense Refill  . estradiol (CLIMARA - DOSED  IN MG/24 HR) 0.1 mg/24hr patch Place 0.1 mg onto the skin once a week.      . flecainide (TAMBOCOR) 50 MG tablet Take 0.5 tablets (25 mg total) by mouth 2 (two) times daily.  60 tablet  2   No current facility-administered medications for this visit.    Allergies  Allergen Reactions  . Ceftin [Cefuroxime Axetil] Swelling    Face numbing, tongue swelling  . Shellfish Allergy Swelling    Face/lips goes numb  . Ciprofloxacin Hives    Review of Systems negative except from HPI and PMH  Physical Exam BP 117/78  Pulse 82  Ht 5' 6.5" (1.689 m)  Wt 148 lb 6.4 oz (67.314 kg)  BMI 23.60 kg/m2  LMP 06/08/2013 Well developed and nourished in no acute distress HENT normal Neck supple with JVP-flat Clear Regular rate and rhythm, no murmurs or gallops Abd-soft with active BS No Clubbing cyanosis edema Skin-warm and dry A & Oriented  Grossly normal sensory and motor function  Orthostatic tracings were reviewed from Dr. Haynes Kerns  it was noted that she had PVCs with RVOT morphology i.e. 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  I am concerned with her episode of pleuritic pain.  We will check a d-dimer.  We'll see her in 6 months

## 2014-07-25 NOTE — Patient Instructions (Signed)
Your physician recommends that you have lab work today: D-Dimer  Your physician wants you to follow-up in: 6 months with Dr.Klein. You will receive a reminder letter in the mail two months in advance. If you don't receive a letter, please call our office to schedule the follow-up appointment.  Your physician recommends that you continue on your current medications as directed. Please refer to the Current Medication list given to you today.

## 2014-10-25 ENCOUNTER — Telehealth: Payer: Self-pay | Admitting: Internal Medicine

## 2014-10-25 NOTE — Telephone Encounter (Signed)
Dr. Caryl Comes spoke with patient.  She will call us next week to let us know how she is doing.

## 2014-10-25 NOTE — Telephone Encounter (Signed)
Pt has been on flecainide and doesn't seem to be working, having lightheadedness when standing, does it need to be adjusted? Appt? pls advise

## 2014-11-14 ENCOUNTER — Telehealth: Payer: Self-pay | Admitting: Internal Medicine

## 2014-11-14 DIAGNOSIS — Z79899 Other long term (current) drug therapy: Secondary | ICD-10-CM

## 2014-11-14 NOTE — Telephone Encounter (Signed)
New Message  Pt was told to follow up w/ Rn if previous conditions did not improve. Pt was told that she could possibly come in for potassium labs and wants to speak w/ Rn about what to do going forward. Please call back and discuss.

## 2014-11-19 ENCOUNTER — Other Ambulatory Visit: Payer: Self-pay | Admitting: Internal Medicine

## 2014-11-19 MED ORDER — FLECAINIDE ACETATE 50 MG PO TABS: ORAL_TABLET | ORAL | Status: DC

## 2014-11-19 NOTE — Telephone Encounter (Signed)
Advised patient Dr. Caryl Comes recommends increasing Flecainide to 75 mg daily - 50 mg in a.m. & 25 mg in p.m.. Will follow up with [facility] in 2 weeks on 3/7. Patient will call in symptoms remain same or don't improve after increasing medication.

## 2014-12-02 ENCOUNTER — Other Ambulatory Visit (INDEPENDENT_AMBULATORY_CARE_PROVIDER_SITE_OTHER): Payer: BC Managed Care – PPO | Admitting: *Deleted

## 2014-12-02 DIAGNOSIS — Z79899 Other long term (current) drug therapy: Secondary | ICD-10-CM

## 2014-12-02 LAB — BASIC METABOLIC PANEL
BUN: 11 mg/dL (ref 6–23)
CHLORIDE: 102 meq/L (ref 96–112)
CO2: 33 meq/L — AB (ref 19–32)
CREATININE: 0.71 mg/dL (ref 0.40–1.20)
Calcium: 9.1 mg/dL (ref 8.4–10.5)
GFR: 95.77 mL/min (ref >60.00)
Glucose, Bld: 94 mg/dL (ref 70–99)
POTASSIUM: 3.6 meq/L (ref 3.5–5.1)
SODIUM: 137 meq/L (ref 135–145)

## 2015-01-24 ENCOUNTER — Encounter: Payer: Self-pay | Admitting: Internal Medicine

## 2015-01-30 ENCOUNTER — Encounter: Payer: Self-pay | Admitting: Internal Medicine

## 2015-01-30 ENCOUNTER — Ambulatory Visit (INDEPENDENT_AMBULATORY_CARE_PROVIDER_SITE_OTHER): Payer: BC Managed Care – PPO | Admitting: Internal Medicine

## 2015-01-30 VITALS — BP 132/74 | HR 64 | Ht 66.5 in | Wt 148.6 lb

## 2015-01-30 DIAGNOSIS — I493 Ventricular premature depolarization: Secondary | ICD-10-CM

## 2015-01-30 NOTE — Patient Instructions (Signed)
Medication Instructions:  Your physician recommends that you continue on your current medications as directed. Please refer to the Current Medication list given to you today.   Labwork: None ordered  Testing/Procedures: None ordered  Follow-Up: Your physician recommends that you schedule a follow-up appointment in: 3 months with Dr Klein   Any Other Special Instructions Will Be Listed Below (If Applicable).   

## 2015-01-30 NOTE — Progress Notes (Signed)
Patient Care Team: Crist Infante, MD as PCP - General (Internal Medicine)   HPI  [name] is a 44 y.o. female Seen in followup for orthostatic intolerance with symptoms suggestive of at this autonomic syndrome. Was thought potentially be post viral given the relatively abrupt onset of her symptoms. No medicinal approaches were suggested including an abdominal binder fluid intake and exercise.  The abdominal binder was associated with palpitations and a fullness in her neck. It  did not seem to attenuate her symptom;  ultimately Dr. Joylene Draft had to proved was a second time that he made the diagnosis of orthostatic PVCs. At that point we started her on flecainide   She is vastly improved. She has occasional lightheaded spells that are very brief. Nausea is also largely gone  She continues to work on fluid and salt repletion.  She went to see the friend of her friend who works in the [employer]. She was started on Ivabradine. Looking back in her records her average heart rate was about 77 on Holter monitoring a year ago. She thinks she is somewhat better.  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 Abnormal uterine bleeding   . Abdominal pain   . Anemia   . Anxiety   . Depression   . Frequent sinus infections   . Peripheral neuropathy   . Sleep disturbance, unspecified   . Pain in joint, multiple sites   . Fatigue     Past Surgical History  Procedure Laterality Date  . No past surgeries    . Abdominal hysterectomy N/A 06/25/2013    Procedure: HYSTERECTOMY ABDOMINAL;  Surgeon: Darlyn Chamber, MD;  Location: [facility];  Service: Gynecology;  Laterality: N/A;  . Salpingoophorectomy Bilateral 06/25/2013    Procedure: SALPINGO OOPHORECTOMY;  Surgeon: Darlyn Chamber, MD;  Location: [facility];  Service: Gynecology;  Laterality: Bilateral;    Current Outpatient Prescriptions  Medication Sig Dispense Refill    . estradiol (CLIMARA - DOSED IN MG/24 HR) 0.1 mg/24hr patch Place 0.1 mg onto the skin once a week.    . flecainide (TAMBOCOR) 50 MG tablet Take 50 mg in the morning. Take 25 mg in the evening. (Patient taking differently: Take 50 mg by mouth. Take 25 mg by mouth in the morning. Take 25 mg by mouth in the evening.) 45 tablet 1  . ivabradine (CORLANOR) 5 MG TABS tablet Take 5 mg by mouth 2 (two) times daily with a meal. Pt takes 5 mg by mouth in the morning. Takes 5 mg by mouth in the evening    . sertraline (ZOLOFT) 50 MG tablet Take 50 mg by mouth daily.     No current facility-administered medications for this visit.    Allergies  Allergen Reactions  . Ceftin [Cefuroxime Axetil] Swelling    Face numbing, tongue swelling  . Shellfish Allergy Swelling    Face/lips goes numb  . 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   She was started on Ivabradine cardiologist at [place] with some interval improvement. Her blood pressures are low but were elevated.    We spent more than 50% of our >25 min visit in face to face counseling regarding the above

## 2015-02-07 ENCOUNTER — Other Ambulatory Visit: Payer: Self-pay | Admitting: Internal Medicine

## 2015-02-21 ENCOUNTER — Telehealth: Payer: Self-pay | Admitting: Internal Medicine

## 2015-02-25 ENCOUNTER — Telehealth: Payer: Self-pay | Admitting: *Deleted

## 2015-02-25 NOTE — Telephone Encounter (Signed)
Samples provided to patient and also a copay card as she has Pharmacist, community. She tells me that her physician at [facility] normally provides her with samples, but she let herself deplete her supply from them before realizing it. She called here hoping that Dr Caryl Comes would at least have some to provide her with so that she does not miss a dose. She has called the office at [facility] and will follow up with them for future samples and is aware that we are unable to provide samples on a monthly basis.

## 2015-02-25 NOTE — Telephone Encounter (Signed)
Patient requests corlanor samples, but this was prescribed by a physician at [facility] [place] per notes. Ok to give? Please advise. Thanks, MI

## 2015-02-25 NOTE — Telephone Encounter (Signed)
Ok to give patient samples, per Dr. Caryl Comes. Please address why needing samples - in case she need to look into assistance if necessary.

## 2015-03-08 ENCOUNTER — Ambulatory Visit (INDEPENDENT_AMBULATORY_CARE_PROVIDER_SITE_OTHER): Payer: BC Managed Care – PPO | Admitting: Family Medicine

## 2015-03-08 VITALS — BP 118/70 | HR 70 | Temp 98.2°F | Ht 66.25 in | Wt 147.4 lb

## 2015-03-08 DIAGNOSIS — L237 Allergic contact dermatitis due to plants, except food: Secondary | ICD-10-CM | POA: Diagnosis not present

## 2015-03-08 MED ORDER — PREDNISONE 20 MG PO TABS: 20.0000 mg | ORAL_TABLET | ORAL | Status: DC

## 2015-03-08 NOTE — Progress Notes (Signed)
° °  Subjective:    Patient ID: [name], female    DOB: [birth year], 44 y.o.   MRN: [id number] This chart was scribed for Robyn Haber, MD by Zola Button, Medical Scribe. This patient was seen in Room 5 and the patient's care was started at 12:02 PM.   HPI HPI Comments: [name] is a 44 y.o. female who presents to the [facility] complaining of gradual onset rashes that first started in her face, but has spread throughout the rest of her body. This started about 12 days ago after exposure to poison ivy. She was seen by her PCP the day after and was placed on a 6-day course of prednisone. The symptoms did improve on the prednisone; however, when she woke up this morning, she started breaking out again. She has never had poison ivy before.  She is a Radio producer  Review of Systems  Skin: Positive for rash.       Objective:   Physical Exam CONSTITUTIONAL: Well developed/well nourished. She is obviously uncomfortable with this very pruritic rash HEAD: Normocephalic/atraumatic EYES: EOM/PERRL ENMT: Mucous membranes moist NECK: supple no meningeal signs SPINE: entire spine nontender CV: S1/S2 noted, no murmurs/rubs/gallops noted LUNGS: Lungs are clear to auscultation bilaterally, no apparent distress ABDOMEN: soft, nontender, no rebound or guarding GU: no cva tenderness NEURO: Pt is awake/alert, moves all extremitiesx4 EXTREMITIES: pulses normal, full ROM SKIN: warm, color normal. Patient has diffuse papular eruption and streaks on her arms, legs, abdomen, and face. PSYCH: no abnormalities of mood noted      Assessment & Plan:   This chart was scribed in my presence and reviewed by me personally.    ICD-9-CM ICD-10-CM   1. Poison ivy 692.6 L23.7 predniSONE (DELTASONE) 20 MG tablet   Keep cool. Try swimming in the pool or taking cool showers temporarily until the prednisone is chance to work  Signed, Robyn Haber, MD

## 2015-03-08 NOTE — Patient Instructions (Signed)

## 2015-04-11 ENCOUNTER — Other Ambulatory Visit: Payer: Self-pay | Admitting: Obstetrics and Gynecology

## 2015-04-11 DIAGNOSIS — R928 Other abnormal and inconclusive findings on diagnostic imaging of breast: Secondary | ICD-10-CM

## 2015-04-21 ENCOUNTER — Ambulatory Visit
Admission: RE | Admit: 2015-04-21 | Discharge: 2015-04-21 | Disposition: A | Discharge status: Home / Self Care | Payer: BC Managed Care – PPO | Source: Ambulatory Visit | Attending: Obstetrics and Gynecology | Admitting: Obstetrics and Gynecology

## 2015-04-21 DIAGNOSIS — R928 Other abnormal and inconclusive findings on diagnostic imaging of breast: Secondary | ICD-10-CM

## 2015-05-01 ENCOUNTER — Other Ambulatory Visit: Payer: Self-pay

## 2015-05-06 ENCOUNTER — Encounter: Payer: Self-pay | Admitting: Internal Medicine

## 2015-05-06 ENCOUNTER — Other Ambulatory Visit: Payer: Self-pay

## 2015-05-06 ENCOUNTER — Ambulatory Visit (INDEPENDENT_AMBULATORY_CARE_PROVIDER_SITE_OTHER): Payer: BC Managed Care – PPO | Admitting: Internal Medicine

## 2015-05-06 VITALS — BP 134/76 | HR 79 | Ht 66.5 in | Wt 154.4 lb

## 2015-05-06 DIAGNOSIS — I493 Ventricular premature depolarization: Secondary | ICD-10-CM | POA: Diagnosis not present

## 2015-05-06 NOTE — Progress Notes (Signed)
Patient Care Team: Crist Infante, MD as PCP - General (Internal Medicine)   HPI  [name] is a 44 y.o. female Seen in followup for orthostatic intolerance with symptoms suggestive of at this autonomic syndrome. Was thought potentially be post viral given the relatively abrupt onset of her symptoms. No medicinal approaches were suggested including an abdominal binder fluid intake and exercise.  The abdominal binder was associated with palpitations and a fullness in her neck. It  did not seem to attenuate her symptom;  ultimately Dr. Joylene Draft had to proved was a second time that he made the diagnosis of orthostatic PVCs. At that point we started her on flecainide   She is vastly improved. She has occasional lightheaded spells that are very brief. Nausea is also largely gone  She continues to work on fluid and salt repletion.  She continues to succeed teaching. Demetrios Isaacs!! 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 Abnormal uterine bleeding   . Abdominal pain   . Anemia   . Anxiety   . Depression   . Frequent sinus infections   . Peripheral neuropathy   . Sleep disturbance, unspecified   . Pain in joint, multiple sites   . Fatigue     Past Surgical History  Procedure Laterality Date  . No past surgeries    . Abdominal hysterectomy N/A 06/25/2013    Procedure: HYSTERECTOMY ABDOMINAL;  Surgeon: Darlyn Chamber, MD;  Location: [facility];  Service: Gynecology;  Laterality: N/A;  . Salpingoophorectomy Bilateral 06/25/2013    Procedure: SALPINGO OOPHORECTOMY;  Surgeon: Darlyn Chamber, MD;  Location: [facility];  Service: Gynecology;  Laterality: Bilateral;    Current Outpatient Prescriptions  Medication Sig Dispense Refill  . estradiol (CLIMARA - DOSED IN MG/24 HR) 0.1 mg/24hr patch Place 0.1 mg onto the skin once a week.    . flecainide (TAMBOCOR) 50 MG  tablet Take 25 mg by mouth 2 (two) times daily.    . ivabradine (CORLANOR) 5 MG TABS tablet Take 5 mg by mouth 2 (two) times daily with a meal.     . sertraline (ZOLOFT) 50 MG tablet Take 50 mg by mouth daily.     No current facility-administered medications for this visit.    Allergies  Allergen Reactions  . Ceftin [Cefuroxime Axetil] Swelling    Face numbing, tongue swelling  . Shellfish Allergy Swelling    Face/lips goes numb  . 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  She was started on Ivabradine cardiologist at [place] with some interval improvement. Her blood pressures were low but now mildly elevated.  Increase night time fluids

## 2015-05-06 NOTE — Patient Instructions (Signed)
Medication Instructions:  Your physician recommends that you continue on your current medications as directed. Please refer to the Current Medication list given to you today.  Labwork: None ordered  Testing/Procedures: None ordered  Follow-Up: Your physician wants you to follow-up in: 1 year with Dr. Klein.  You will receive a reminder letter in the mail two months in advance. If you don't receive a letter, please call our office to schedule the follow-up appointment.  Any Other Special Instructions Will Be Listed Below (If Applicable). Thank you for choosing [facility]!!        

## 2015-06-07 IMAGING — US US TRANSVAGINAL NON-OB
1 series · 14 of 25 positions shown · non-contrast
Comparison: None

CLINICAL DATA: Abnormal uterine bleeding, some pelvic pain

TRANSABDOMINAL AND TRANSVAGINAL ULTRASOUND OF PELVIS
TECHNIQUE: Both transabdominal and transvaginal ultrasound
examinations of the pelvis were performed including evaluation of
the uterus, ovaries, adnexal regions, and pelvic cul-de-sac.

[Series 1: us transvaginal non-ob · 0.28mm/px · 14 of 112 slices shown]
[im 1/112]
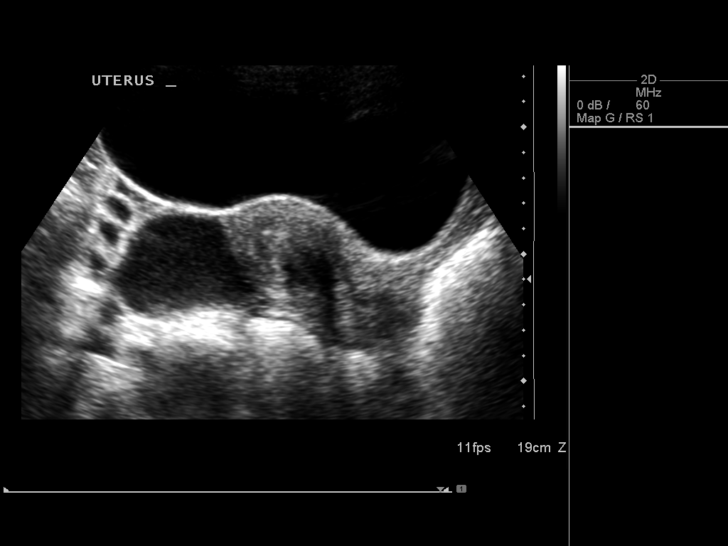
[im 10/112]
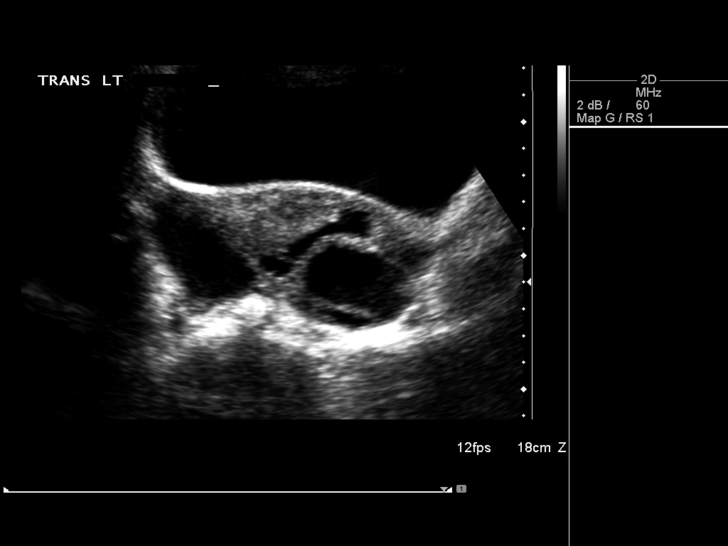
[im 19/112]
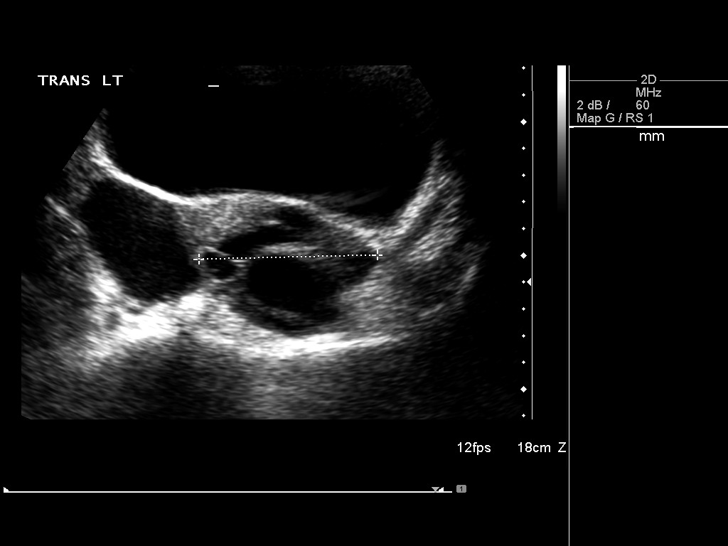
[im 28/112]
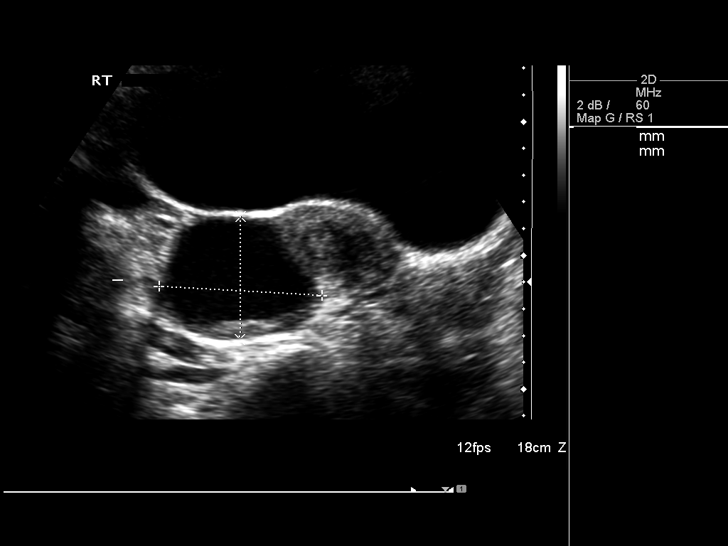
[im 38/112]
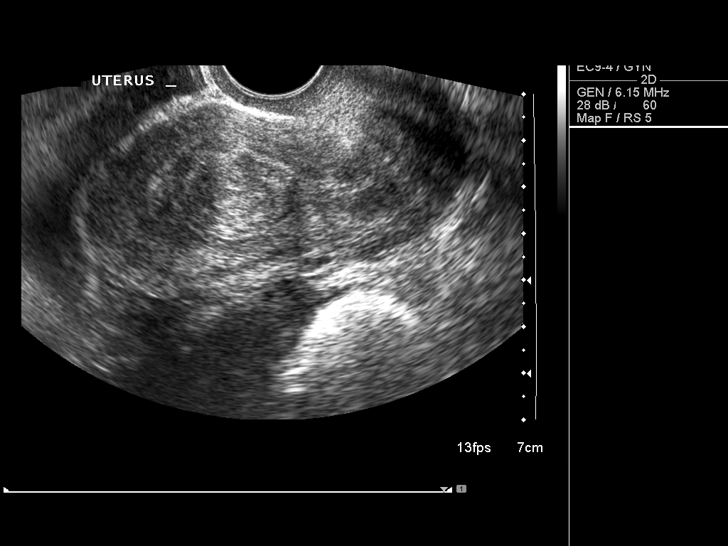
[im 42/112]
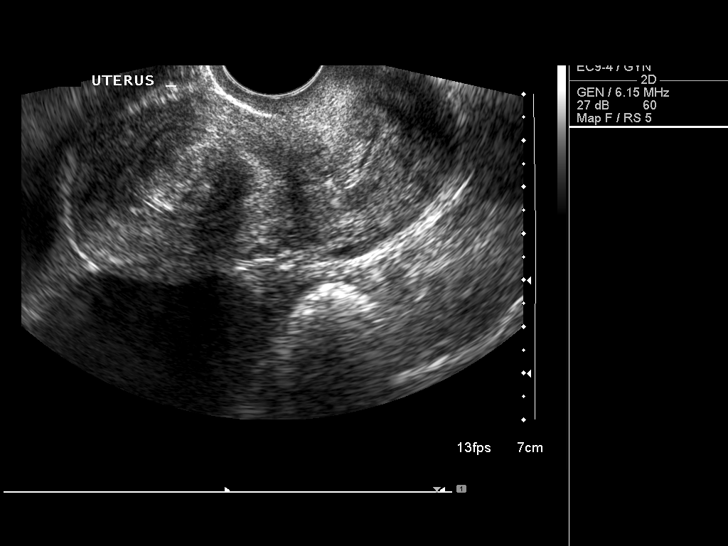
[im 51/112]
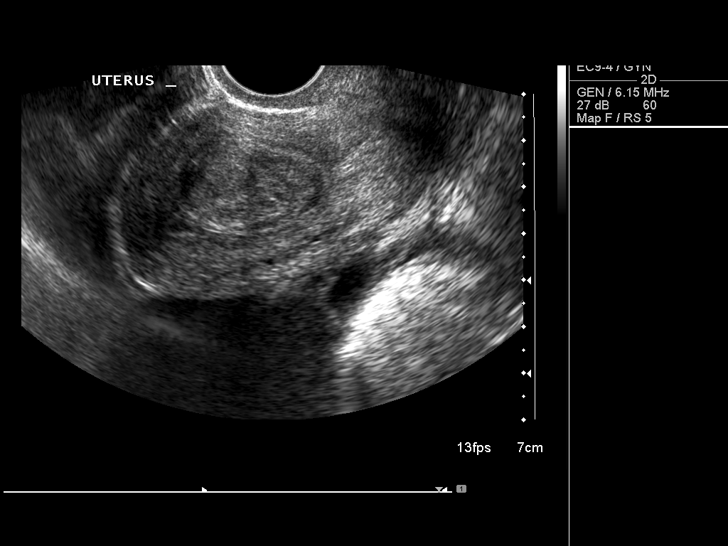
[im 61/112]
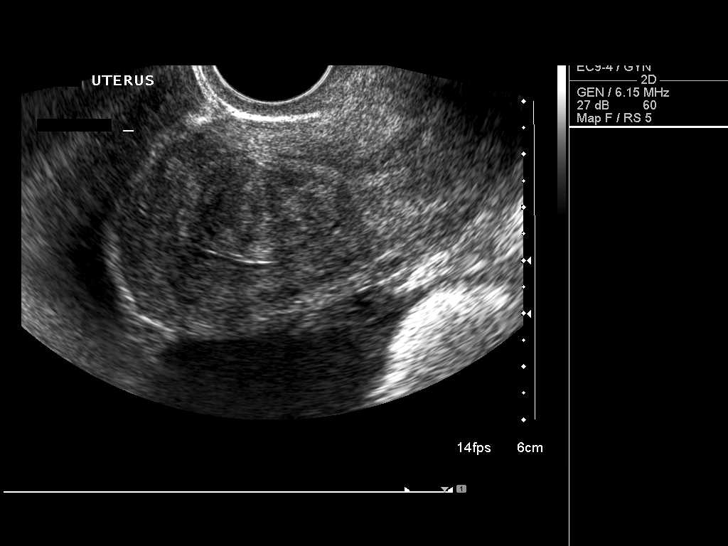
[im 70/112]
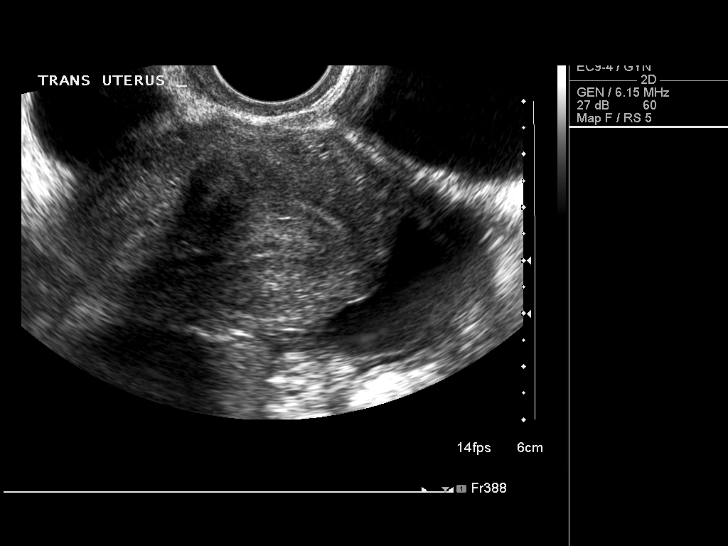
[im 75/112]
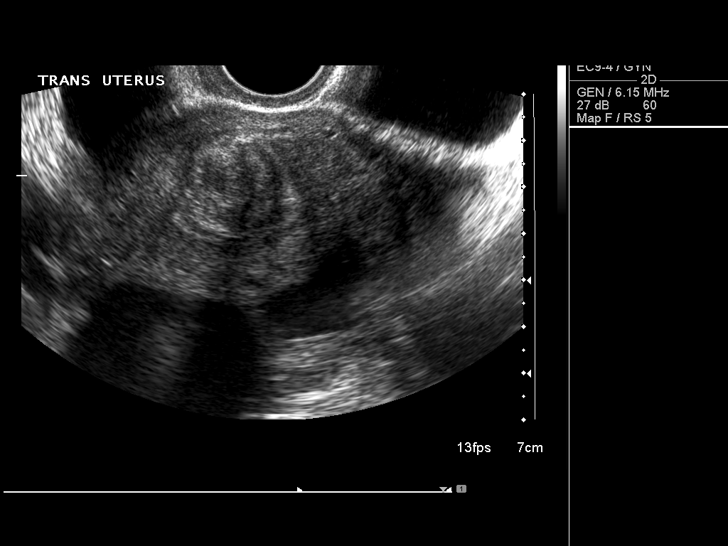
[im 84/112]
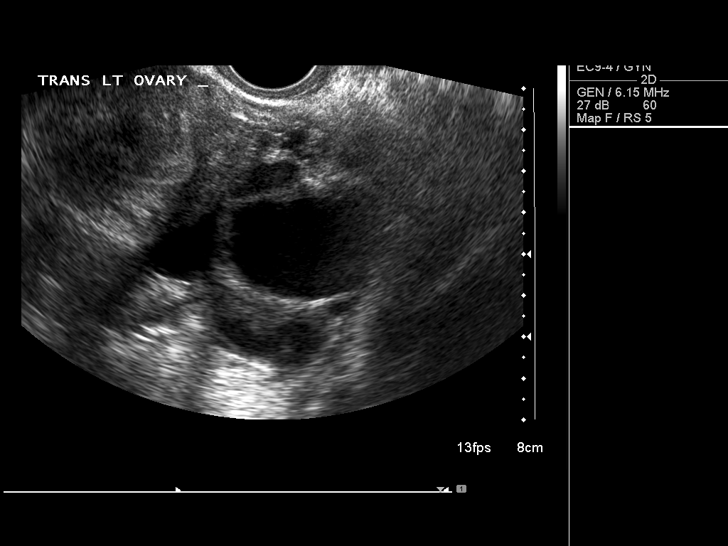
[im 93/112]
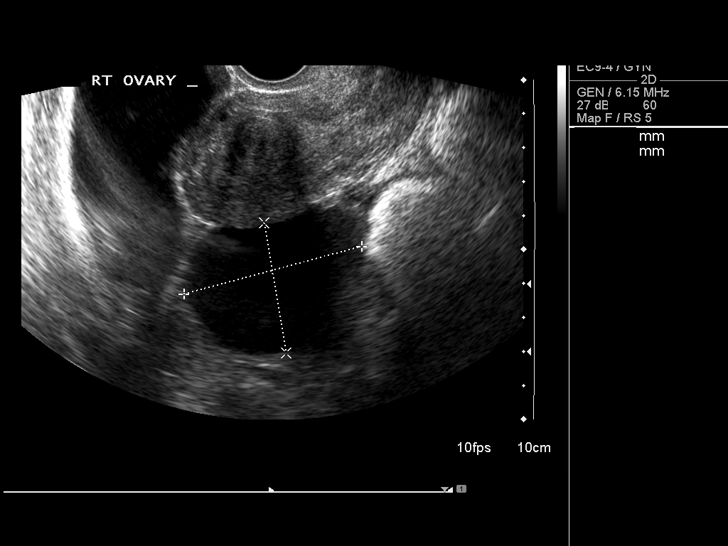
[im 102/112]
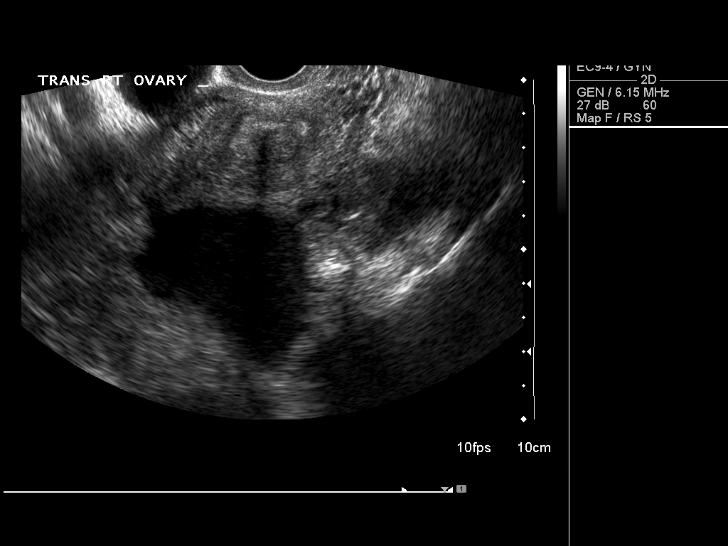
[im 112/112]
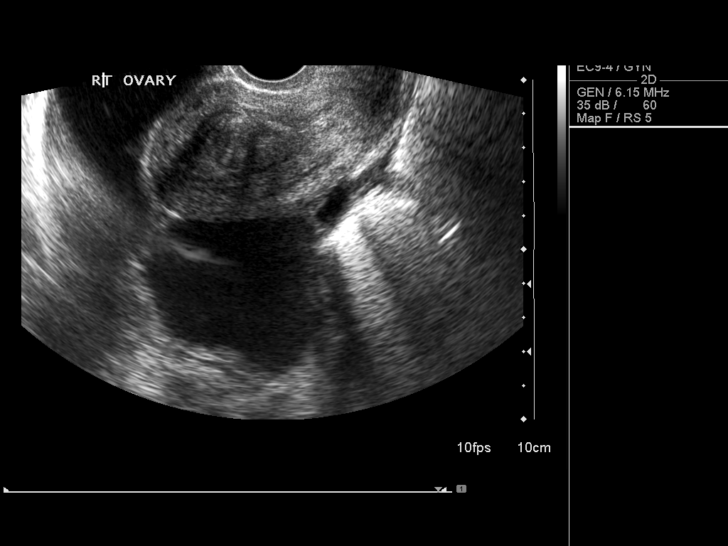

[14 of 25 positions shown; findings below may reference images not displayed]

FINDINGS: Uterus: The uterus measures 8.9 cm sagittally with a depth of
cm and width of 6.5 cm.

Endometrium:The endometrium is difficult to measure with the best
measurement being 22 mm which includes an oval polypoid mass
measuring 3.1 x 2.0 x 2.0 cm. Biopsy is recommended.

Right Ovary :The right ovary measures 5.6 x 4.2 x 5.4 cm.  This
includes a slightly complex cystic structure of 5.4 x 3.9 x 5.4 cm.

Left Ovary :The left ovary measures 5.9 x 4.9 x 5.9 cm with a
complex and stated cyst entire left ovary.

Other Findings:  There is only a trace amount of free fluid
present.
IMPRESSION: 1.  There is an oval soft tissue mass within the endometrium -
endometrial cavity consistent with a large polyp of 3.1 x 2.0 x
cm.  Recommend excision.
2.  Complex cysts involve both ovaries with very little visualized
normal ovarian tissue.

## 2015-08-05 ENCOUNTER — Other Ambulatory Visit: Payer: Self-pay | Admitting: Internal Medicine

## 2015-08-05 ENCOUNTER — Other Ambulatory Visit: Payer: Self-pay | Admitting: *Deleted

## 2015-08-05 MED ORDER — FLECAINIDE ACETATE 50 MG PO TABS: ORAL_TABLET | ORAL | Status: DC

## 2015-08-05 NOTE — Telephone Encounter (Signed)
Follow up   Pt wants a call from RN

## 2015-12-03 ENCOUNTER — Telehealth: Payer: Self-pay | Admitting: Internal Medicine

## 2015-12-03 NOTE — Telephone Encounter (Signed)
New message      Patient calling the office for samples of medication:   1.  What medication and dosage are you requesting samples for?  corlanor 5mg   2.  Are you currently out of this medication?  almost

## 2015-12-04 NOTE — Telephone Encounter (Signed)
Spoke with patient to let her know that I would place some samples at the front desk for her. I also asked her about using another copay card but she stated that her insurance has changed and they do not cover the corlanor nor is she able to use the copay card. She would like to remain on this medication as it is working well for her, so I am giving her a few samples and in the meantime she will see what options are available.

## 2016-03-16 ENCOUNTER — Telehealth: Payer: Self-pay | Admitting: *Deleted

## 2016-03-16 NOTE — Telephone Encounter (Signed)
Patient called for corlanor samples. Samples placed at the front desk. Called patient, no answer and vm was unidentified.

## 2016-08-01 ENCOUNTER — Other Ambulatory Visit: Payer: Self-pay | Admitting: Internal Medicine

## 2016-09-09 ENCOUNTER — Encounter: Payer: Self-pay | Admitting: Internal Medicine

## 2016-09-29 ENCOUNTER — Encounter: Payer: Self-pay | Admitting: Internal Medicine

## 2016-09-29 ENCOUNTER — Ambulatory Visit (INDEPENDENT_AMBULATORY_CARE_PROVIDER_SITE_OTHER): Payer: BC Managed Care – PPO | Admitting: Internal Medicine

## 2016-09-29 VITALS — BP 120/86 | HR 74 | Ht 66.0 in | Wt 160.0 lb

## 2016-09-29 DIAGNOSIS — I493 Ventricular premature depolarization: Secondary | ICD-10-CM | POA: Diagnosis not present

## 2016-09-29 MED ORDER — FLECAINIDE ACETATE 50 MG PO TABS: 25.0000 mg | ORAL_TABLET | Freq: Two times a day (BID) | ORAL | 3 refills | Status: DC

## 2016-09-29 NOTE — Patient Instructions (Signed)
Medication Instructions: - Your physician recommends that you continue on your current medications as directed. Please refer to the Current Medication list given to you today  Labwork: - none ordered  Procedures/Testing: - none ordered  Follow-Up: - Your physician wants you to follow-up in: 1 year with Michelle Seiler, NP for Dr. Klein. You will receive a reminder letter in the mail two months in advance. If you don't receive a letter, please call our office to schedule the follow-up appointment.   Any Additional Special Instructions Will Be Listed Below (If Applicable).     If you need a refill on your cardiac medications before your next appointment, please call your pharmacy.   

## 2016-09-29 NOTE — Progress Notes (Signed)
      Patient Care Team: Crist Infante, MD as PCP - General (Internal Medicine)   HPI  [name] is a 46 y.o. female Seen in followup for orthostatic intolerance with symptoms suggestive of at this autonomic syndrome. Was thought potentially be post viral given the relatively abrupt onset of her symptoms. No medicinal approaches were suggested including an abdominal binder fluid intake and exercise.  The abdominal binder was associated with palpitations and a fullness in her neck. It  did not seem to attenuate her symptom;  ultimately Dr. Joylene Draft had to proved was a second time that he made the diagnosis of orthostatic PVCs. At that point we started her on flecainide   She is vastly improved. She has occasional lightheaded spells that are very brief.    She continues to work on fluid and salt repletion.  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 Abdominal pain   . Abnormal uterine bleeding   . Anemia   . Anxiety   . Depression   . Fatigue   . Frequent sinus infections   . Pain in joint, multiple sites   . Peripheral neuropathy ([place])   . Sleep disturbance, unspecified     Past Surgical History:  Procedure Laterality Date  . ABDOMINAL HYSTERECTOMY N/A 06/25/2013   Procedure: HYSTERECTOMY ABDOMINAL;  Surgeon: Darlyn Chamber, MD;  Location: [facility];  Service: Gynecology;  Laterality: N/A;  . NO PAST SURGERIES    . SALPINGOOPHORECTOMY Bilateral 06/25/2013   Procedure: SALPINGO OOPHORECTOMY;  Surgeon: Darlyn Chamber, MD;  Location: [facility];  Service: Gynecology;  Laterality: Bilateral;    Current Outpatient Prescriptions  Medication Sig Dispense Refill  . flecainide (TAMBOCOR) 50 MG tablet TAKE ONE-HALF TABLET BY MOUTH TWICE DAILY 90 tablet 3  . ivabradine (CORLANOR) 7.5 MG TABS tablet Take 0.5 tablet by mouth twice daily    . sertraline (ZOLOFT) 50 MG tablet Take 50 mg by mouth daily.      No current facility-administered medications for this visit.     Allergies  Allergen Reactions  . Ceftin [Cefuroxime Axetil] Swelling    Face numbing, tongue swelling  . Shellfish Allergy Swelling    Face/lips goes numb  . 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  Her chest discomfort I suspect is reflux. We discussed again the use of PPIs

## 2017-01-23 ENCOUNTER — Encounter (HOSPITAL_BASED_OUTPATIENT_CLINIC_OR_DEPARTMENT_OTHER): Payer: Self-pay | Admitting: Emergency Medicine

## 2017-01-23 ENCOUNTER — Emergency Department (HOSPITAL_BASED_OUTPATIENT_CLINIC_OR_DEPARTMENT_OTHER)
Admission: EM | Admit: 2017-01-23 | Discharge: 2017-01-23 | Disposition: A | Discharge status: Home / Self Care | Payer: BC Managed Care – PPO | Attending: Emergency Medicine | Admitting: Emergency Medicine

## 2017-01-23 ENCOUNTER — Emergency Department (HOSPITAL_BASED_OUTPATIENT_CLINIC_OR_DEPARTMENT_OTHER): Payer: BC Managed Care – PPO

## 2017-01-23 DIAGNOSIS — Y939 Activity, unspecified: Secondary | ICD-10-CM | POA: Insufficient documentation

## 2017-01-23 DIAGNOSIS — Y929 Unspecified place or not applicable: Secondary | ICD-10-CM | POA: Diagnosis not present

## 2017-01-23 DIAGNOSIS — X58XXXA Exposure to other specified factors, initial encounter: Secondary | ICD-10-CM | POA: Insufficient documentation

## 2017-01-23 DIAGNOSIS — S39012A Strain of muscle, fascia and tendon of lower back, initial encounter: Secondary | ICD-10-CM | POA: Diagnosis not present

## 2017-01-23 DIAGNOSIS — Y999 Unspecified external cause status: Secondary | ICD-10-CM | POA: Diagnosis not present

## 2017-01-23 DIAGNOSIS — R200 Anesthesia of skin: Secondary | ICD-10-CM | POA: Diagnosis not present

## 2017-01-23 DIAGNOSIS — M545 Low back pain: Secondary | ICD-10-CM | POA: Diagnosis present

## 2017-01-23 MED ORDER — CYCLOBENZAPRINE HCL 10 MG PO TABS: 10.0000 mg | ORAL_TABLET | Freq: Two times a day (BID) | ORAL | 0 refills | Status: DC | PRN

## 2017-01-23 MED ORDER — MELOXICAM 7.5 MG PO TABS: 7.5000 mg | ORAL_TABLET | Freq: Every day | ORAL | 0 refills | Status: DC

## 2017-01-23 NOTE — Discharge Instructions (Signed)
If you develop any new symptoms or if symptoms worsen, please return to the Emergency Department for re-evaluation. This includes worsening numbness/tingling, weakness, fever, chills, or the inability to hold your bladder or bowels. If your back pain persists despite taking flexeril, anti-inflammatory medication (mobic/meloxicam), please follow up with your primary care provider.

## 2017-01-23 NOTE — ED Triage Notes (Signed)
Lower back since Thursday, worse when bending over. Denies recent injury or urinary s/s , numbness to toes this today. Took tylenol  at 0830 and flexeril .flexeril about 45 years old

## 2017-01-23 NOTE — ED Provider Notes (Signed)
[facility] Provider Note   CSN: [id number] Arrival date & time: 01/23/17  1009     History   Chief Complaint Chief Complaint  Patient presents with  . Back Pain    HPI [name] is a 46 y.o. female who presents to the Emergency Department for sudden onset of bilateral low back pain (R>L) that began 4 days ago and has continued to worsen. She reports aggravation of the pain with movement, but the pain improves once she is in a new position. She is able to ambulate, but is no longer able to bend over due to the discomfort. She reports that came to the ED for evaluation because the toes on her bilateral feet "felt asleep and numb" since she awoke this morning. No numbness or tingling in the bilateral feet or legs. No recent trauma or injury. No h/o of previous back injuries. No fever, chills, lightheadedness, dizziness, urinary or bowel incontinence, urinary retention, dysuria, abdominal pain, or chest pain. She reports she treated the back pain with Tylenol and a years old prescription of Flexeril with no relief.   No h/o of CA. No IVDU. Employed as a Pharmacist, hospital.   HPI  Past Medical History:  Diagnosis Date  . Abdominal pain   . Abnormal uterine bleeding   . Anemia   . Anxiety   . Depression   . Fatigue   . Frequent sinus infections   . Pain in joint, multiple sites   . Peripheral neuropathy   . Sleep disturbance, unspecified     Patient Active Problem List   Diagnosis Date Noted  . Muscle spasm 07/17/2014  . Episodic lightheadedness 12/21/2013  . Syncope and collapse 10/30/2013  . Endometriosis, severe 06/25/2013    Class: Hospitalized for  . S/P total abdominal hysterectomy and bilateral salpingo-oophorectomy 06/25/2013    Class: Status post  . Ovarian cyst 05/15/2013  . Fibroid 05/15/2013    Past Surgical History:  Procedure Laterality Date  . ABDOMINAL HYSTERECTOMY N/A 06/25/2013   Procedure: HYSTERECTOMY ABDOMINAL;  Surgeon: Darlyn Chamber, MD;   Location: [facility];  Service: Gynecology;  Laterality: N/A;  . NO PAST SURGERIES    . SALPINGOOPHORECTOMY Bilateral 06/25/2013   Procedure: SALPINGO OOPHORECTOMY;  Surgeon: Darlyn Chamber, MD;  Location: [facility];  Service: Gynecology;  Laterality: Bilateral;    OB History    No data available       Home Medications    Prior to Admission medications   Medication Sig Start Date End Date Taking? Authorizing Provider  flecainide (TAMBOCOR) 50 MG tablet Take 0.5 tablets (25 mg total) by mouth 2 (two) times daily. 09/29/16  Yes Deboraha Sprang, MD  ivabradine (CORLANOR) 7.5 MG TABS tablet Take 0.5 tablet by mouth twice daily   Yes Historical Provider, MD  sertraline (ZOLOFT) 50 MG tablet Take 50 mg by mouth daily.   Yes Historical Provider, MD  cyclobenzaprine (FLEXERIL) 10 MG tablet Take 1 tablet (10 mg total) by mouth 2 (two) times daily as needed for muscle spasms. 01/23/17   Lenda Baratta A Zonya Gudger, PA-C  meloxicam (MOBIC) 7.5 MG tablet Take 1 tablet (7.5 mg total) by mouth daily. 01/23/17   Nilesh Stegall A Mendell Bontempo, PA-C    Family History Family History  Problem Relation Age of Onset  . Heart disease Mother   . Diabetes Mother   . Heart attack Mother   . CAD Mother   . Lung cancer Father   . Diabetes Sister  Social History Social History  Substance Use Topics  . Smoking status: Never Smoker  . Smokeless tobacco: Never Used  . Alcohol use Yes     Comment: rarely     Allergies   Ceftin [cefuroxime axetil]; Shellfish allergy; and Ciprofloxacin   Review of Systems Review of Systems  Constitutional: Negative for activity change, chills and fever.  Eyes: Negative for visual disturbance.  Respiratory: Negative for cough and shortness of breath.   Cardiovascular: Negative for chest pain and leg swelling.  Gastrointestinal: Negative for abdominal pain, diarrhea, nausea and vomiting.  Genitourinary: Negative for dysuria and flank pain.  Musculoskeletal: Positive for back pain. Negative for gait  problem and neck pain.  Skin: Negative for rash.  Allergic/Immunologic: Negative for immunocompromised state.  Neurological: Positive for numbness. Negative for dizziness, syncope, weakness and headaches.  Psychiatric/Behavioral: Negative for confusion.     Physical Exam Updated Vital Signs BP 116/73 (BP Location: Right Arm)   Pulse (!) 51   Temp 98.7 F (37.1 C) (Oral)   Resp 16   Ht 5\' 6"  (1.676 m)   Wt 68 kg   LMP 06/15/2013   SpO2 100%   BMI 24.21 kg/m   Physical Exam  Constitutional: She is oriented to person, place, and time. She appears well-developed and well-nourished. No distress.  Uncomfortable appearing.   HENT:  Head: Normocephalic and atraumatic.  Eyes: Conjunctivae are normal.  Neck: Neck supple.  Cardiovascular: Normal rate, regular rhythm and normal heart sounds.  Exam reveals no gallop and no friction rub.   No murmur heard. Pulmonary/Chest: Effort normal and breath sounds normal. No respiratory distress. She has no wheezes. She has no rales.  Abdominal: Soft. She exhibits no distension and no mass. There is no tenderness. There is no guarding.  No bruits.   Musculoskeletal: She exhibits tenderness. She exhibits no edema or deformity.  No cervical or thoracic mid-line, bony tenderness. TTP over the spinous processes of the lumbar spine. Point tenderness immediately lateral to the spine on the right side. Left paraspinal muscles are diffusely tender, but much less than the right. 5/5 bilateral motor strength. Normal gait. Patellar reflexes are 2+ bilaterally. 2+ DP and PT pulses.   Full active and passive ROM of the bilateral hips, knees, and ankles.   Neurological: She is alert and oriented to person, place, and time. No cranial nerve deficit.  Decreased sensation to the dorsum of the toes on the bilateral feet. Sensation to the plantar surface of the toes, the plantar and dorsum of the foot, and the lower leg is intact. Full passive and active ROM of the  toes, foot, and ankle without difficulty. 2+ PT and DP pulses. 5/5 strength against resistance to the bilateral lower extremities, including the digits.   TTP approximately 3 cm lateral from the lumbar spine with mild diffuse surrounding tenderness to the bilateral paraspinal muscles of the lumbar spine.   The cervical and thoracic spines are non-tender. No paraspinal muscle tenderness.   Skin: Skin is warm and dry. No rash noted. She is not diaphoretic.  Psychiatric: Her behavior is normal.  Nursing note and vitals reviewed.  ED Treatments / Results  Labs (all labs ordered are listed, but only abnormal results are displayed) Labs Reviewed - No data to display  EKG  EKG Interpretation None       Radiology Dg Lumbar Spine Complete  Result Date: 01/23/2017 CLINICAL DATA:  Low back pain for 3 days. Bilateral foot numbness. No known injury. EXAM: LUMBAR  SPINE - COMPLETE 4+ VIEW COMPARISON:  None. FINDINGS: There is no evidence of lumbar spine fracture. Alignment is normal. Intervertebral disc spaces are maintained. No other osseous abnormality identified. IMPRESSION: Negative. Electronically Signed   By: Earle Gell M.D.   On: 01/23/2017 11:29    Procedures Procedures (including critical care time)  Medications Ordered in ED Medications - No data to display   Initial Impression / Assessment and Plan / ED Course  I have reviewed the triage vital signs and the nursing notes.  Pertinent labs & imaging results that were available during my care of the patient were reviewed by me and considered in my medical decision making (see chart for details).     Patient with back pain, likely mechanical in nature.  Decreased sensation to the dorsum of the toes on the bilateral feet; sensation to the plantar surface of the toes, and all surfaces of the feet and lower leg. Patient can walk but states it is painful.  No urinary or bowel incontinence.  No concern for AAA, infection, or fracture.   No constitutional symptoms including fever, chills, or weight loss,  No h/o cancer, IVDU.  Low suspicion for cauda equina or spinal cord compression upon evaluation at this time. Will discharge to home with RICE protocol and anti-inflammatory medicine. Discussed ED strict return precaution including if the numbness progresses, changes, or if she develops weakness, or motor dysfunction. Discussed indications for PCP follow up. The patient has good follow up and seems very reliable. The patient acknowledges the plan and is agreeable at this time.   Final Clinical Impressions(s) / ED Diagnoses   Final diagnoses:  Strain of lumbar region, initial encounter  Numbness of toes   New Prescriptions Discharge Medication List as of 01/23/2017  1:30 PM    START taking these medications   Details  cyclobenzaprine (FLEXERIL) 10 MG tablet Take 1 tablet (10 mg total) by mouth 2 (two) times daily as needed for muscle spasms., Starting Sun 01/23/2017, Print    meloxicam (MOBIC) 7.5 MG tablet Take 1 tablet (7.5 mg total) by mouth daily., Starting Sun 01/23/2017, Print         Joanne Gavel, PA-C 01/23/17 [place], MD 01/24/17 [phone]

## 2019-09-19 NOTE — Telephone Encounter (Signed)
error 

## 2020-09-25 ENCOUNTER — Encounter: Payer: Self-pay | Admitting: Gastroenterology

## 2020-11-10 ENCOUNTER — Other Ambulatory Visit: Payer: Self-pay

## 2020-11-10 ENCOUNTER — Ambulatory Visit (AMBULATORY_SURGERY_CENTER): Payer: Self-pay | Admitting: *Deleted

## 2020-11-10 VITALS — Ht 66.0 in | Wt 156.0 lb

## 2020-11-10 DIAGNOSIS — Z800 Family history of malignant neoplasm of digestive organs: Secondary | ICD-10-CM

## 2020-11-10 DIAGNOSIS — Z1211 Encounter for screening for malignant neoplasm of colon: Secondary | ICD-10-CM

## 2020-11-10 NOTE — Progress Notes (Signed)
Patient is here in-person for PV. Patient denies any allergies to eggs or soy. Patient denies any problems with anesthesia/sedation. Patient denies any oxygen use at home. Patient denies taking any diet/weight loss medications or blood thinners. Patient is not being treated for MRSA or C-diff. Patient is aware of our care-partner policy and [id number] safety protocol. EMMI education assigned to the patient for the procedure, sent to [place].   COVID-19 vaccines completed x3, per patient.

## 2020-11-19 ENCOUNTER — Encounter: Payer: Self-pay | Admitting: Gastroenterology

## 2020-11-21 ENCOUNTER — Other Ambulatory Visit: Payer: Self-pay

## 2020-11-21 ENCOUNTER — Encounter: Payer: Self-pay | Admitting: Gastroenterology

## 2020-11-21 ENCOUNTER — Ambulatory Visit (AMBULATORY_SURGERY_CENTER): Payer: BC Managed Care – PPO | Admitting: Gastroenterology

## 2020-11-21 VITALS — BP 85/43 | HR 57 | Temp 97.5°F | Resp 13 | Ht 66.0 in | Wt 156.0 lb

## 2020-11-21 DIAGNOSIS — Z1211 Encounter for screening for malignant neoplasm of colon: Secondary | ICD-10-CM | POA: Diagnosis not present

## 2020-11-21 DIAGNOSIS — Z800 Family history of malignant neoplasm of digestive organs: Secondary | ICD-10-CM | POA: Diagnosis not present

## 2020-11-21 MED ORDER — SODIUM CHLORIDE 0.9 % IV SOLN: 500.0000 mL | Freq: Once | INTRAVENOUS | Status: DC

## 2020-11-21 NOTE — Patient Instructions (Signed)
YOU HAD AN ENDOSCOPIC PROCEDURE TODAY AT THE [facility]:   Refer to the procedure report that was given to you for any specific questions about what was found during the examination.  If the procedure report does not answer your questions, please call your gastroenterologist to clarify.  If you requested that your care partner not be given the details of your procedure findings, then the procedure report has been included in a sealed envelope for you to review at your convenience later.  YOU SHOULD EXPECT: Some feelings of bloating in the abdomen. Passage of more gas than usual.  Walking can help get rid of the air that was put into your GI tract during the procedure and reduce the bloating. If you had a lower endoscopy (such as a colonoscopy or flexible sigmoidoscopy) you may notice spotting of blood in your stool or on the toilet paper. If you underwent a bowel prep for your procedure, you may not have a normal bowel movement for a few days.  Please Note:  You might notice some irritation and congestion in your nose or some drainage.  This is from the oxygen used during your procedure.  There is no need for concern and it should clear up in a day or so.  SYMPTOMS TO REPORT IMMEDIATELY:   Following lower endoscopy (colonoscopy or flexible sigmoidoscopy):  Excessive amounts of blood in the stool  Significant tenderness or worsening of abdominal pains  Swelling of the abdomen that is new, acute  Fever of 100F or higher  For urgent or emergent issues, a gastroenterologist can be reached at any hour by calling [phone]. Do not use MyChart messaging for urgent concerns.    DIET:  We do recommend a small meal at first, but then you may proceed to your regular diet.  Drink plenty of fluids but you should avoid alcoholic beverages for 24 hours.  ACTIVITY:  You should plan to take it easy for the rest of today and you should NOT DRIVE or use heavy machinery until tomorrow (because  of the sedation medicines used during the test).    FOLLOW UP: Our staff will call the number listed on your records 48-72 hours following your procedure to check on you and address any questions or concerns that you may have regarding the information given to you following your procedure. If we do not reach you, we will leave a message.  We will attempt to reach you two times.  During this call, we will ask if you have developed any symptoms of COVID 19. If you develop any symptoms (ie: fever, flu-like symptoms, shortness of breath, cough etc.) before then, please call [phone].  If you test positive for Covid 19 in the 2 weeks post procedure, please call and report this information to us.    If any biopsies were taken you will be contacted by phone or by letter within the next 1-3 weeks.  Please call us at [phone] if you have not heard about the biopsies in 3 weeks.    SIGNATURES/CONFIDENTIALITY: You and/or your care partner have signed paperwork which will be entered into your electronic medical record.  These signatures attest to the fact that that the information above on your After Visit Summary has been reviewed and is understood.  Full responsibility of the confidentiality of this discharge information lies with you and/or your care-partner. 

## 2020-11-21 NOTE — Progress Notes (Signed)
Medical history reviewed with no changes since PV. VS assessed by C.W 

## 2020-11-21 NOTE — Progress Notes (Signed)
To pacu, VSS. Report to [email or web address] 

## 2020-11-21 NOTE — Op Note (Signed)
[facility] Patient Name: [name] Procedure Date: 11/21/2020 7:07 AM MRN: [id number] Endoscopist: Milus Banister , MD Age: 50 Referring MD:  Date of Birth: [birth year] Gender: Female Account #: [id number] Procedure:                Colonoscopy Indications:              Screening in patient at increased risk: Family                            history of 1st-degree relative with colorectal                            cancer before age 72 years; brother with colon                            cancer at age 55 Medicines:                Monitored Anesthesia Care Procedure:                Pre-Anesthesia Assessment:                           - Prior to the procedure, a History and Physical                            was performed, and patient medications and                            allergies were reviewed. The patient's tolerance of                            previous anesthesia was also reviewed. The risks                            and benefits of the procedure and the sedation                            options and risks were discussed with the patient.                            All questions were answered, and informed consent                            was obtained. Prior Anticoagulants: The patient has                            taken no previous anticoagulant or antiplatelet                            agents. ASA Grade Assessment: II - A patient with                            mild systemic disease. After reviewing the risks  and benefits, the patient was deemed in                            satisfactory condition to undergo the procedure.                           After obtaining informed consent, the colonoscope                            was passed under direct vision. Throughout the                            procedure, the patient's blood pressure, pulse, and                            oxygen saturations were monitored continuously.  The                            Olympus CF-HQ190L (Serial# [id number]) Colonoscope was                            introduced through the anus and advanced to the the                            cecum, identified by appendiceal orifice and                            ileocecal valve. The colonoscopy was performed                            without difficulty. The patient tolerated the                            procedure well. The quality of the bowel                            preparation was good. The ileocecal valve,                            appendiceal orifice, and rectum were photographed. Scope In: 7:55:00 AM Scope Out: 8:07:09 AM Scope Withdrawal Time: 0 hours 7 minutes 18 seconds  Total Procedure Duration: 0 hours 12 minutes 9 seconds  Findings:                 The entire examined colon appeared normal on direct                            and retroflexion views. Complications:            No immediate complications. Estimated blood loss:                            None. Estimated Blood Loss:     Estimated blood loss: none. Impression:               - The entire  examined colon is normal on direct and                            retroflexion views.                           - No polyps or cancers. Recommendation:           - Patient has a contact number available for                            emergencies. The signs and symptoms of potential                            delayed complications were discussed with the                            patient. Return to normal activities tomorrow.                            Written discharge instructions were provided to the                            patient.                           - Resume previous diet.                           - Continue present medications.                           - Repeat colonoscopy in 5 years given your family                            history of colon cancer. Milus Banister, MD 11/21/2020 8:30:18 AM This report has  been signed electronically.

## 2020-11-25 ENCOUNTER — Telehealth: Payer: Self-pay | Admitting: *Deleted

## 2020-11-25 NOTE — Telephone Encounter (Signed)
  Follow up Call-  Call back number 11/21/2020  Post procedure Call Back phone  # [phone]  Permission to leave phone message Yes  Some recent data might be hidden     Patient questions:  Do you have a fever, pain , or abdominal swelling? No. Pain Score  0 *  Have you tolerated food without any problems? Yes.    Have you been able to return to your normal activities? Yes.    Do you have any questions about your discharge instructions: Diet   No. Medications  No. Follow up visit  No.  Do you have questions or concerns about your Care? No.  Actions: * If pain score is 4 or above: No action needed, pain <4

## 2021-01-14 ENCOUNTER — Other Ambulatory Visit (HOSPITAL_COMMUNITY): Payer: Self-pay

## 2021-01-14 MED ORDER — MOLNUPIRAVIR 200 MG PO CAPS
800.0000 mg | ORAL_CAPSULE | Freq: Two times a day (BID) | ORAL | 0 refills | Status: DC
  Filled 2021-01-14: qty 40, 5d supply, fill #0

## 2022-03-24 NOTE — Progress Notes (Unsigned)
Cardiology Office Note:   Date:  03/25/2022  NAME:  [name]    MRN: [id number] DOB:  May 04, 1971   PCP:  Crist Infante, MD  Cardiologist:  Evalina Field, MD  Electrophysiologist:  None   Referring MD: Crist Infante, MD   Chief Complaint  Patient presents with   pvc    History of Present Illness:   [name] is a 51 y.o. female with a hx of PVC who is being seen today for the evaluation of PVC at the request of Crist Infante, MD. she has a longstanding history of positional PVCs.  She informs me that she was followed by Jolyn Nap for many years.  She actually was seeing a cardiologist in [place] at the recommendation of her friend.  Her mother is [name] who is a patient of mine.  Given her mother's ongoing medical needs she can no longer drive down to [place] for the appointment.  She has been placed on flecainide for positional PVCs.  Symptoms have improved tremendously on this medication.  She had no issues.  Her EKG demonstrates normal sinus rhythm with no acute ischemic changes or evidence of infarction.  QRS duration 86 ms.  She has had echocardiograms in the past that have been normal.  Her cholesterol is a bit elevated but she is on no medication.  Her mom has a considerable history of CAD as well as cardiovascular disease.  We discussed calcium scoring.  She is not diabetic.  No history of heart attack or stroke.  She is married.  No children.  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  She overall is without complaints.  Cardiovascular exam is normal.  EKG is normal.  Total cholesterol 221, triglycerides 110, HDL 48, LDL 151  Past Medical History: Past Medical History:  Diagnosis Date   Abdominal pain    Abnormal uterine bleeding    Anemia    Anxiety    Depression    Fatigue    Frequent sinus infections    Heart murmur    Pain in joint, multiple sites    Peripheral neuropathy    PVC  (premature ventricular contraction)    Sleep disturbance, unspecified    Tachycardia    orhtostatic    Past Surgical History: Past Surgical History:  Procedure Laterality Date   ABDOMINAL HYSTERECTOMY N/A 06/25/2013   Procedure: HYSTERECTOMY ABDOMINAL;  Surgeon: Darlyn Chamber, MD;  Location: [facility];  Service: Gynecology;  Laterality: N/A;   SALPINGOOPHORECTOMY Bilateral 06/25/2013   Procedure: SALPINGO OOPHORECTOMY;  Surgeon: Darlyn Chamber, MD;  Location: [facility];  Service: Gynecology;  Laterality: Bilateral;    Current Medications: Current Meds  Medication Sig   Cyanocobalamin (VITAMIN B-12 PO) Take by mouth.   sertraline (ZOLOFT) 50 MG tablet Take 50 mg by mouth daily.   topiramate (TOPAMAX) 25 MG tablet SMARTSIG:1 Tablet(s) By Mouth Every Evening   VITAMIN D PO Take by mouth.     Allergies:    Ceftin [cefuroxime axetil], Shellfish allergy, and Ciprofloxacin   Social History: Social History   Socioeconomic History   Marital status: Single    Spouse name: Not on file   Number of children: Not on file   Years of education: Not on file   Highest education level: Not on file  Occupational History   Occupation: Architect  Tobacco Use   Smoking status: Never   Smokeless tobacco: Never  Vaping Use   Vaping Use: Never used  Substance and Sexual Activity   Alcohol use: Not Currently   Drug use: No   Sexual activity: Never  Other Topics Concern   Not on file  Social History Narrative   Not on file   Social Determinants of Health   Financial Resource Strain: Not on file  Food Insecurity: Not on file  Transportation Needs: Not on file  Physical Activity: Not on file  Stress: Not on file  Social Connections: Not on file     Family History: The patient's family history includes CAD in her mother; Colon cancer (age of onset: 19) in her brother; Colon polyps in her mother; Diabetes in her mother and sister; Heart attack in her mother; Heart disease  in her mother; Lung cancer in her father. There is no history of Esophageal cancer, Stomach cancer, or Rectal cancer.  ROS:   All other ROS reviewed and negative. Pertinent positives noted in the HPI.     EKGs/Labs/Other Studies Reviewed:   The following studies were personally reviewed by me today:  EKG:  EKG is ordered today.  The ekg ordered today demonstrates normal sinus rhythm heart rate 70, no acute ischemic changes or evidence of infarction, and was personally reviewed by me.   Recent Labs: No results found for requested labs within last 365 days.   Recent Lipid Panel No results found for: "CHOL", "TRIG", "HDL", "CHOLHDL", "VLDL", "LDLCALC", "LDLDIRECT"  Physical Exam:   VS:  BP 138/72   Pulse 70   Ht 5' 6.5" (1.689 m)   Wt 162 lb 3.2 oz (73.6 kg)   LMP 06/15/2013   SpO2 94%   BMI 25.79 kg/m    Wt Readings from Last 3 Encounters:  03/25/22 162 lb 3.2 oz (73.6 kg)  11/21/20 156 lb (70.8 kg)  11/10/20 156 lb (70.8 kg)    General: Well nourished, well developed, in no acute distress Head: Atraumatic, normal size  Eyes: PEERLA, EOMI  Neck: Supple, no JVD Endocrine: No thryomegaly Cardiac: Normal S1, S2; RRR; no murmurs, rubs, or gallops Lungs: Clear to auscultation bilaterally, no wheezing, rhonchi or rales  Abd: Soft, nontender, no hepatomegaly  Ext: No edema, pulses 2+ Musculoskeletal: No deformities, BUE and BLE strength normal and equal Skin: Warm and dry, no rashes   Neuro: Alert and oriented to person, place, time, and situation, CNII-XII grossly intact, no focal deficits  Psych: Normal mood and affect   ASSESSMENT:   [name] is a 51 y.o. female who presents for the following: 1. PVC (premature ventricular contraction)   2. Mixed hyperlipidemia     PLAN:   1. PVC (premature ventricular contraction) -Long history of PVCs that appear to be positional.  Stable on flecainide 25 mg twice daily.  She will continue current medication.  Not on an AV  nodal agent and this is okay.  Has been on this medication for years.  EKG demonstrates sinus rhythm with a narrow QRS.  Echocardiograms in the past have been normal.  Stress testing has been normal.  2. Mixed hyperlipidemia -Elevated LDL cholesterol.  Mother has considerable history of cardiovascular disease.  We will proceed with coronary calcium scoring for restratification.      Disposition: Return in about 1 year (around 03/26/2023).  Medication Adjustments/Labs and Tests Ordered: Current medicines are reviewed at length with the patient today.  Concerns regarding medicines are outlined above.  Orders Placed This Encounter  Procedures   CT CARDIAC SCORING (  SELF PAY ONLY)   EKG 12-Lead   Meds ordered this encounter  Medications   flecainide (TAMBOCOR) 50 MG tablet    Sig: Take 0.5 tablets (25 mg total) by mouth 2 (two) times daily.    Dispense:  90 tablet    Refill:  3    Patient Instructions  Medication Instructions:  Your Physician recommend you continue on your current medication as directed.    *If you need a refill on your cardiac medications before your next appointment, please call your pharmacy*   Lab Work: None ordered today   Testing/Procedures: CT coronary calcium score.   [facility] ([place], [place] [address])   This is $99 out of pocket.   Coronary CalciumScan A coronary calcium scan is an imaging test used to look for deposits of calcium and other fatty materials (plaques) in the inner lining of the blood vessels of the heart (coronary arteries). These deposits of calcium and plaques can partly clog and narrow the coronary arteries without producing any symptoms or warning signs. This puts a person at risk for a heart attack. This test can detect these deposits before symptoms develop. Tell a health care provider about: Any allergies you have. All medicines you are taking, including vitamins, herbs, eye drops, creams, and  over-the-counter medicines. Any problems you or family members have had with anesthetic medicines. Any blood disorders you have. Any surgeries you have had. Any medical conditions you have. Whether you are pregnant or may be pregnant. What are the risks? Generally, this is a safe procedure. However, problems may occur, including: Harm to a pregnant woman and her unborn baby. This test involves the use of radiation. Radiation exposure can be dangerous to a pregnant woman and her unborn baby. If you are pregnant, you generally should not have this procedure done. Slight increase in the risk of cancer. This is because of the radiation involved in the test. What happens before the procedure? No preparation is needed for this procedure. What happens during the procedure? You will undress and remove any jewelry around your neck or chest. You will put on a hospital gown. Sticky electrodes will be placed on your chest. The electrodes will be connected to an electrocardiogram (ECG) machine to record a tracing of the electrical activity of your heart. A CT scanner will take pictures of your heart. During this time, you will be asked to lie still and hold your breath for 2-3 seconds while a picture of your heart is being taken. The procedure may vary among health care providers and hospitals. What happens after the procedure? You can get dressed. You can return to your normal activities. It is up to you to get the results of your test. Ask your health care provider, or the department that is doing the test, when your results will be ready. Summary A coronary calcium scan is an imaging test used to look for deposits of calcium and other fatty materials (plaques) in the inner lining of the blood vessels of the heart (coronary arteries). Generally, this is a safe procedure. Tell your health care provider if you are pregnant or may be pregnant. No preparation is needed for this procedure. A CT scanner  will take pictures of your heart. You can return to your normal activities after the scan is done. This information is not intended to replace advice given to you by your health care provider. Make sure you discuss any questions you have with your health care provider.  Document Released: 03/11/2008 Document Revised: 08/02/2016 Document Reviewed: 08/02/2016 Elsevier Interactive Patient Education  2017 [place]: At [facility], you and your health needs are our priority.  As part of our continuing mission to provide you with exceptional heart care, we have created designated Provider Care Teams.  These Care Teams include your primary Cardiologist (physician) and Advanced Practice Providers (APPs -  Physician Assistants and Nurse Practitioners) who all work together to provide you with the care you need, when you need it.  We recommend signing up for the patient portal called "MyChart".  Sign up information is provided on this After Visit Summary.  MyChart is used to connect with patients for Virtual Visits (Telemedicine).  Patients are able to view lab/test results, encounter notes, upcoming appointments, etc.  Non-urgent messages can be sent to your provider as well.   To learn more about what you can do with MyChart, go to [email or web address].    Your next appointment:   1 year(s)  The format for your next appointment:   In Person  Provider:   Evalina Field, MD {            Signed, Addison Naegeli. Audie Box, MD, [place]  [address], [place], [place] [address] [phone]  03/25/2022 4:06 PM

## 2022-03-25 ENCOUNTER — Encounter: Payer: Self-pay | Admitting: Cardiovascular Disease

## 2022-03-25 ENCOUNTER — Ambulatory Visit: Payer: BC Managed Care – PPO | Admitting: Cardiovascular Disease

## 2022-03-25 VITALS — BP 138/72 | HR 70 | Ht 66.5 in | Wt 162.2 lb

## 2022-03-25 DIAGNOSIS — E782 Mixed hyperlipidemia: Secondary | ICD-10-CM

## 2022-03-25 DIAGNOSIS — I493 Ventricular premature depolarization: Secondary | ICD-10-CM

## 2022-03-25 MED ORDER — FLECAINIDE ACETATE 50 MG PO TABS: 25.0000 mg | ORAL_TABLET | Freq: Two times a day (BID) | ORAL | 3 refills | Status: DC

## 2022-03-25 NOTE — Patient Instructions (Signed)
Medication Instructions:  Your Physician recommend you continue on your current medication as directed.    *If you need a refill on your cardiac medications before your next appointment, please call your pharmacy*   Lab Work: None ordered today   Testing/Procedures: CT coronary calcium score.   [facility]  ([place], [place] [address])   This is $99 out of pocket.   Coronary CalciumScan A coronary calcium scan is an imaging test used to look for deposits of calcium and other fatty materials (plaques) in the inner lining of the blood vessels of the heart (coronary arteries). These deposits of calcium and plaques can partly clog and narrow the coronary arteries without producing any symptoms or warning signs. This puts a person at risk for a heart attack. This test can detect these deposits before symptoms develop. Tell a health care provider about: Any allergies you have. All medicines you are taking, including vitamins, herbs, eye drops, creams, and over-the-counter medicines. Any problems you or family members have had with anesthetic medicines. Any blood disorders you have. Any surgeries you have had. Any medical conditions you have. Whether you are pregnant or may be pregnant. What are the risks? Generally, this is a safe procedure. However, problems may occur, including: Harm to a pregnant woman and her unborn baby. This test involves the use of radiation. Radiation exposure can be dangerous to a pregnant woman and her unborn baby. If you are pregnant, you generally should not have this procedure done. Slight increase in the risk of cancer. This is because of the radiation involved in the test. What happens before the procedure? No preparation is needed for this procedure. What happens during the procedure? You will undress and remove any jewelry around your neck or chest. You will put on a hospital gown. Sticky electrodes will be placed on your chest.  The electrodes will be connected to an electrocardiogram (ECG) machine to record a tracing of the electrical activity of your heart. A CT scanner will take pictures of your heart. During this time, you will be asked to lie still and hold your breath for 2-3 seconds while a picture of your heart is being taken. The procedure may vary among health care providers and hospitals. What happens after the procedure? You can get dressed. You can return to your normal activities. It is up to you to get the results of your test. Ask your health care provider, or the department that is doing the test, when your results will be ready. Summary A coronary calcium scan is an imaging test used to look for deposits of calcium and other fatty materials (plaques) in the inner lining of the blood vessels of the heart (coronary arteries). Generally, this is a safe procedure. Tell your health care provider if you are pregnant or may be pregnant. No preparation is needed for this procedure. A CT scanner will take pictures of your heart. You can return to your normal activities after the scan is done. This information is not intended to replace advice given to you by your health care provider. Make sure you discuss any questions you have with your health care provider. Document Released: 03/11/2008 Document Revised: 08/02/2016 Document Reviewed: 08/02/2016 Elsevier Interactive Patient Education  2017 [place]: At [facility], you and your health needs are our priority.  As part of our continuing mission to provide you with exceptional heart care, we have created designated Provider Care Teams.  These Care Teams include  your primary Cardiologist (physician) and Advanced Practice Providers (APPs -  Physician Assistants and Nurse Practitioners) who all work together to provide you with the care you need, when you need it.  We recommend signing up for the patient portal called "MyChart".  Sign up  information is provided on this After Visit Summary.  MyChart is used to connect with patients for Virtual Visits (Telemedicine).  Patients are able to view lab/test results, encounter notes, upcoming appointments, etc.  Non-urgent messages can be sent to your provider as well.   To learn more about what you can do with MyChart, go to [email or web address].    Your next appointment:   1 year(s)  The format for your next appointment:   In Person  Provider:   Evalina Field, MD {

## 2022-04-02 ENCOUNTER — Ambulatory Visit (HOSPITAL_BASED_OUTPATIENT_CLINIC_OR_DEPARTMENT_OTHER)
Admission: RE | Admit: 2022-04-02 | Discharge: 2022-04-02 | Disposition: A | Discharge status: Home / Self Care | Payer: BC Managed Care – PPO | Source: Ambulatory Visit | Attending: Cardiovascular Disease | Admitting: Cardiovascular Disease

## 2022-04-02 DIAGNOSIS — E782 Mixed hyperlipidemia: Secondary | ICD-10-CM | POA: Insufficient documentation

## 2022-11-16 ENCOUNTER — Encounter: Payer: Self-pay | Admitting: Cardiovascular Disease

## 2022-11-19 ENCOUNTER — Encounter: Payer: Self-pay | Admitting: Cardiovascular Disease

## 2022-11-19 ENCOUNTER — Ambulatory Visit: Payer: BC Managed Care – PPO | Attending: Cardiovascular Disease | Admitting: Cardiovascular Disease

## 2022-11-19 VITALS — BP 116/76 | HR 65 | Ht 66.5 in | Wt 159.2 lb

## 2022-11-19 DIAGNOSIS — R079 Chest pain, unspecified: Secondary | ICD-10-CM

## 2022-11-19 DIAGNOSIS — I493 Ventricular premature depolarization: Secondary | ICD-10-CM

## 2022-11-19 MED ORDER — IBUPROFEN 800 MG PO TABS: 800.0000 mg | ORAL_TABLET | Freq: Three times a day (TID) | ORAL | 0 refills | Status: DC

## 2022-11-19 NOTE — Progress Notes (Signed)
Cardiology Office Note:   Date:  11/19/2022  NAME:  [name]    MRN: [id number] DOB:  1971/04/01   PCP:  Crist Infante, MD  Cardiologist:  Evalina Field, MD  Electrophysiologist:  None   Referring MD: Crist Infante, MD   Chief Complaint  Patient presents with   Chest Pain   History of Present Illness:   [name] is a 52 y.o. female with a hx of PVCs who presents for follow-up.  She reports for the last 5 days she has had tightness in her chest.  Symptoms are fairly constant.  She reports that occurs while laying down.  She can feel occasional fluttering in her chest.  Symptoms are constant.  Not worsened by position change.  No fever.  No cough or congestion.  Her EKG shows normal sinus rhythm with no acute ischemic changes or evidence of infarction.  Her calcium score was 0.  Her mother who is my patient is currently in hospice care for pancreatic cancer.  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  We discussed stress and anxiety.  She reports she feels good.  I do question if something stressful could be contributing to this.  Clearly her mother in hospice is a stressful situation.  No recent change in medications.  No infectious symptoms.  All of her lab work recently was unremarkable.  CV examination normal.  Lungs are clear.  Does not appear to be associated with food.  Symptoms sound noncardiac.  Problem List PVCs -on flecanide  -CAC score =0 2. HLD -T chol 215, HDL 52, LDL 146, triglycerides 84  Past Medical History: Past Medical History:  Diagnosis Date   Abdominal pain    Abnormal uterine bleeding    Anemia    Anxiety    Depression    Fatigue    Frequent sinus infections    Heart murmur    Pain in joint, multiple sites    Peripheral neuropathy    PVC (premature ventricular contraction)    Sleep disturbance, unspecified    Tachycardia    orhtostatic    Past Surgical History: Past Surgical History:  Procedure Laterality Date   ABDOMINAL  HYSTERECTOMY N/A 06/25/2013   Procedure: HYSTERECTOMY ABDOMINAL;  Surgeon: Darlyn Chamber, MD;  Location: [facility];  Service: Gynecology;  Laterality: N/A;   SALPINGOOPHORECTOMY Bilateral 06/25/2013   Procedure: SALPINGO OOPHORECTOMY;  Surgeon: Darlyn Chamber, MD;  Location: [facility];  Service: Gynecology;  Laterality: Bilateral;    Current Medications: Current Meds  Medication Sig   Cyanocobalamin (VITAMIN B-12 PO) Take by mouth.   estradiol (VIVELLE-DOT) 0.05 MG/24HR patch Place 1 patch onto the skin 2 (two) times a week.   flecainide (TAMBOCOR) 50 MG tablet Take 0.5 tablets (25 mg total) by mouth 2 (two) times daily.   ibuprofen (ADVIL) 800 MG tablet Take 1 tablet (800 mg total) by mouth 3 (three) times daily.   sertraline (ZOLOFT) 100 MG tablet Take 100 mg by mouth daily.   SUMAtriptan (IMITREX) 50 MG tablet Take 50 mg by mouth as needed for headache or migraine.   topiramate (TOPAMAX) 25 MG tablet SMARTSIG:1 Tablet(s) By Mouth Every Evening   VITAMIN D PO Take by mouth.     Allergies:    Ceftin [cefuroxime axetil], Shellfish allergy, and Ciprofloxacin   Social History: Social History   Socioeconomic History   Marital status: Single    Spouse name: Not on file   Number of children: Not  on file   Years of education: Not on file   Highest education level: Not on file  Occupational History   Occupation: Architect  Tobacco Use   Smoking status: Never   Smokeless tobacco: Never  Vaping Use   Vaping Use: Never used  Substance and Sexual Activity   Alcohol use: Not Currently   Drug use: No   Sexual activity: Never  Other Topics Concern   Not on file  Social History Narrative   Not on file   Social Determinants of Health   Financial Resource Strain: Not on file  Food Insecurity: Not on file  Transportation Needs: Not on file  Physical Activity: Not on file  Stress: Not on file  Social Connections: Not on file     Family History: The patient's  family history includes CAD in her mother; Colon cancer (age of onset: 62) in her brother; Colon polyps in her mother; Diabetes in her mother and sister; Heart attack in her mother; Heart disease in her mother; Lung cancer in her father. There is no history of Esophageal cancer, Stomach cancer, or Rectal cancer.  ROS:   All other ROS reviewed and negative. Pertinent positives noted in the HPI.     EKGs/Labs/Other Studies Reviewed:   The following studies were personally reviewed by me today:  EKG:  EKG is ordered today.  The ekg ordered today demonstrates normal sinus rhythm heart rate 65, no acute ischemic changes or evidence of infarction, and was personally reviewed by me.   Recent Labs: No results found for requested labs within last 365 days.   Recent Lipid Panel No results found for: "CHOL", "TRIG", "HDL", "CHOLHDL", "VLDL", "LDLCALC", "LDLDIRECT"  Physical Exam:   VS:  BP 116/76   Pulse 65   Ht 5' 6.5" (1.689 m)   Wt 159 lb 3.2 oz (72.2 kg)   LMP 06/15/2013   SpO2 96%   BMI 25.31 kg/m    Wt Readings from Last 3 Encounters:  11/19/22 159 lb 3.2 oz (72.2 kg)  03/25/22 162 lb 3.2 oz (73.6 kg)  11/21/20 156 lb (70.8 kg)    General: Well nourished, well developed, in no acute distress Head: Atraumatic, normal size  Eyes: PEERLA, EOMI  Neck: Supple, no JVD Endocrine: No thryomegaly Cardiac: Normal S1, S2; RRR; no murmurs, rubs, or gallops Lungs: Clear to auscultation bilaterally, no wheezing, rhonchi or rales  Abd: Soft, nontender, no hepatomegaly  Ext: No edema, pulses 2+ Musculoskeletal: No deformities, BUE and BLE strength normal and equal Skin: Warm and dry, no rashes   Neuro: Alert and oriented to person, place, time, and situation, CNII-XII grossly intact, no focal deficits  Psych: Normal mood and affect   ASSESSMENT:   [name] is a 52 y.o. female who presents for the following: 1. Chest pain of uncertain etiology   2. PVC (premature ventricular  contraction)     PLAN:   1. Chest pain of uncertain etiology -Constant chest discomfort for the last 5 days.  No infectious symptoms.  Symptoms are not positional.  Do not suspect pericarditis.  EKG is normal.  Coronary calcium score is 0.  Overall I have a low suspicion for cardiac etiology.  I have recommended a short course of ibuprofen as this could represent costochondritis or musculoskeletal pain.  Will see if this improves her symptoms.  Could also represent acid reflux.  We discussed this.  No change in diet.  Symptoms could also be stress related.  Her mother has moved to hospice.  She informed me that her mother may only live 1 month or more.  We discussed return precautions.  I will see her for routine follow-up.  She will let us know if symptoms do not resolve.  2. PVC (premature ventricular contraction) -Controlled on flecainide.  No change.      Disposition: Return if symptoms worsen or fail to improve.  Medication Adjustments/Labs and Tests Ordered: Current medicines are reviewed at length with the patient today.  Concerns regarding medicines are outlined above.  Orders Placed This Encounter  Procedures   EKG 12-Lead   Meds ordered this encounter  Medications   ibuprofen (ADVIL) 800 MG tablet    Sig: Take 1 tablet (800 mg total) by mouth 3 (three) times daily.    Dispense:  21 tablet    Refill:  0    Patient Instructions  Medication Instructions:  TAKE ibuprofen 800 mg three times daily for 5 days.   *If you need a refill on your cardiac medications before your next appointment, please call your pharmacy*  Follow-Up: At [facility], you and your health needs are our priority.  As part of our continuing mission to provide you with exceptional heart care, we have created designated Provider Care Teams.  These Care Teams include your primary Cardiologist (physician) and Advanced Practice Providers (APPs -  Physician Assistants and Nurse Practitioners) who all  work together to provide you with the care you need, when you need it.  We recommend signing up for the patient portal called "MyChart".  Sign up information is provided on this After Visit Summary.  MyChart is used to connect with patients for Virtual Visits (Telemedicine).  Patients are able to view lab/test results, encounter notes, upcoming appointments, etc.  Non-urgent messages can be sent to your provider as well.   To learn more about what you can do with MyChart, go to [email or web address].    Your next appointment:   June 2024   Provider:   Evalina Field, MD      Time Spent with Patient: I have spent a total of 25 minutes with patient reviewing hospital notes, telemetry, EKGs, labs and examining the patient as well as establishing an assessment and plan that was discussed with the patient.  > 50% of time was spent in direct patient care.  Signed, Addison Naegeli. Audie Box, MD, [facility]  [address], [place], [place] [address] [phone]  11/19/2022 2:47 PM

## 2022-11-19 NOTE — Patient Instructions (Addendum)
Medication Instructions:  TAKE ibuprofen 800 mg three times daily for 5 days.   *If you need a refill on your cardiac medications before your next appointment, please call your pharmacy*  Follow-Up: At [facility], you and your health needs are our priority.  As part of our continuing mission to provide you with exceptional heart care, we have created designated Provider Care Teams.  These Care Teams include your primary Cardiologist (physician) and Advanced Practice Providers (APPs -  Physician Assistants and Nurse Practitioners) who all work together to provide you with the care you need, when you need it.  We recommend signing up for the patient portal called "MyChart".  Sign up information is provided on this After Visit Summary.  MyChart is used to connect with patients for Virtual Visits (Telemedicine).  Patients are able to view lab/test results, encounter notes, upcoming appointments, etc.  Non-urgent messages can be sent to your provider as well.   To learn more about what you can do with MyChart, go to [email or web address].    Your next appointment:   June 2024   Provider:   Evalina Field, MD

## 2023-07-28 ENCOUNTER — Other Ambulatory Visit: Payer: Self-pay | Admitting: Cardiovascular Disease

## 2023-10-23 ENCOUNTER — Other Ambulatory Visit: Payer: Self-pay | Admitting: Cardiovascular Disease

## 2023-11-27 ENCOUNTER — Other Ambulatory Visit: Payer: Self-pay | Admitting: Cardiovascular Disease

## 2023-12-21 ENCOUNTER — Encounter: Payer: Self-pay | Admitting: Cardiovascular Disease

## 2023-12-27 NOTE — Progress Notes (Unsigned)
 Cardiology Office Note:  .   Date:  12/28/2023  ID:  [name], DOB Oct 09, 1970, MRN [id number] PCP: Rodrigo Ran, MD  [facility] Providers Cardiologist:  Reatha Harps, MD { History of Present Illness: .    Chief Complaint  Patient presents with   Follow-up    [name] is a 52 y.o. female with history of PVCs who presents for follow-up of dizziness.   HGB 13.9 TSH 0.94  History of Present Illness   [name] is a 53 year old female who presents with dizziness.  She has been experiencing intermittent lightheadedness for about a month, particularly when standing for long periods. The sensation is described as feeling 'almost passing out' but not spinning. Initially, her oxygen saturation was 91-92% using her mother's pulse oximeter, but it has since improved to 93-94%, and occasionally 96%. A chest x-ray performed in late February was normal. There have been no new medications or changes in her current regimen, which includes flecainide 25 mg twice daily. No increase in premature ventricular contractions (PVCs), though she occasionally feels a 'flutter' or 'run'.  She has a history of PVCs and is currently on flecainide 25 mg twice daily. She reports occasional fluttering sensations but no significant increase in PVCs. No chest pain, shortness of breath, or cough, though she notes a 'little tightness' at night and in the morning. She is not anemic, and her thyroid function was checked and found to be normal.  She drinks four to five 24-ounce bottles of water daily and reports no issues with her diet. She does not smoke or drink alcohol. Her past medical history includes a low level of good cholesterol noted during a recent physical exam. 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          Problem List PVCs -on flecanide  -CAC score =0 2. HLD -T chol  185, HDL 43, LDL  112, TG 150    ROS: All other ROS reviewed and negative. Pertinent positives noted in the HPI.     Studies Reviewed: Marland Kitchen   EKG Interpretation Date/Time:  Wednesday December 28 2023 07:59:46 EDT Ventricular Rate:  71 PR Interval:  146 QRS Duration:  92 QT Interval:  402 QTC Calculation: 436 R Axis:   94  Text Interpretation: Normal sinus rhythm with sinus arrhythmia Rightward axis Confirmed by Lennie Odor [phone]) on 12/28/2023 8:02:57 AM   CAC 04/02/2022 IMPRESSION: Coronary calcium score of 0. Physical Exam:   VS:  BP 130/76   Pulse 71   Ht 5' 6.5" (1.689 m)   Wt 161 lb 12.8 oz (73.4 kg)   LMP 06/15/2013   SpO2 96%   BMI 25.72 kg/m    Wt Readings from Last 3 Encounters:  12/28/23 161 lb 12.8 oz (73.4 kg)  11/19/22 159 lb 3.2 oz (72.2 kg)  03/25/22 162 lb 3.2 oz (73.6 kg)    GEN: Well nourished, well developed in no acute distress NECK: No JVD; No carotid bruits CARDIAC: RRR, no murmurs, rubs, gallops RESPIRATORY:  Clear to auscultation without rales, wheezing or rhonchi  ABDOMEN: Soft, non-tender, non-distended EXTREMITIES:  No edema; No deformity  ASSESSMENT AND PLAN: .   Assessment and Plan    Premature Ventricular Contractions (PVCs) PVCs are well-controlled on flecainide. Occasional flutter reported without significant increase in PVCs. Cardiovascular examination and EKG are normal. No signs of heart failure. Echocardiogram recommended to monitor  cardiac function on flecainide, as it has been ten years since the last one. - Refill flecainide prescription - Order echocardiogram to monitor cardiac function  Dizziness Intermittent dizziness primarily when standing for long periods. No associated vertigo or worsening symptoms. Oxygen saturation levels fluctuate but are not critically low. No new medications or health changes to explain dizziness. Normal chest X-ray and no signs of heart failure. Differential diagnosis includes orthostatic hypotension or benign positional  vertigo, but no definitive cause identified. Does not appear to be cardiac.  - Advise monitoring oxygen levels only if symptomatic or short of breath - Encourage maintaining hydration and monitoring symptoms              Follow-up: Return in about 1 year (around 12/27/2024).   Signed, Lenna Gilford. Flora Lipps, MD, [facility]  [facility]  [address], [address] [place], [place] [address] [phone]  8:17 AM

## 2023-12-27 NOTE — Telephone Encounter (Signed)
 Patient identification verified by 2 forms. Marilynn Rail, RN    Called and spoke to patient  Patient scheduled for OV 4/2 at 8:00am  Patient agrees, no further questions at this time

## 2023-12-28 ENCOUNTER — Ambulatory Visit: Payer: Self-pay | Attending: Cardiovascular Disease | Admitting: Cardiovascular Disease

## 2023-12-28 ENCOUNTER — Encounter: Payer: Self-pay | Admitting: Cardiovascular Disease

## 2023-12-28 VITALS — BP 130/76 | HR 71 | Ht 66.5 in | Wt 161.8 lb

## 2023-12-28 DIAGNOSIS — I493 Ventricular premature depolarization: Secondary | ICD-10-CM

## 2023-12-28 DIAGNOSIS — E782 Mixed hyperlipidemia: Secondary | ICD-10-CM | POA: Diagnosis not present

## 2023-12-28 DIAGNOSIS — R42 Dizziness and giddiness: Secondary | ICD-10-CM | POA: Diagnosis not present

## 2023-12-28 MED ORDER — FLECAINIDE ACETATE 50 MG PO TABS: 25.0000 mg | ORAL_TABLET | Freq: Two times a day (BID) | ORAL | 1 refills | Status: AC

## 2023-12-28 NOTE — Patient Instructions (Signed)
 Medication Instructions:  Your physician recommends that you continue on your current medications as directed. Please refer to the Current Medication list given to you today.    *If you need a refill on your cardiac medications before your next appointment, please call your pharmacy*   Lab Work: None    If you have labs (blood work) drawn today and your tests are completely normal, you will receive your results only by: MyChart Message (if you have MyChart) OR A paper copy in the mail If you have any lab test that is abnormal or we need to change your treatment, we will call you to review the results.   Testing/Procedures: Echo will be scheduled at [address].  Your physician has requested that you have an echocardiogram. Echocardiography is a painless test that uses sound waves to create images of your heart. It provides your doctor with information about the size and shape of your heart and how well your heart's chambers and valves are working. This procedure takes approximately one hour. There are no restrictions for this procedure. Please do NOT wear cologne, perfume, aftershave, or lotions (deodorant is allowed). Please arrive 15 minutes prior to your appointment time.    Follow-Up: At [facility], you and your health needs are our priority.  As part of our continuing mission to provide you with exceptional heart care, we have created designated Provider Care Teams.  These Care Teams include your primary Cardiologist (physician) and Advanced Practice Providers (APPs -  Physician Assistants and Nurse Practitioners) who all work together to provide you with the care you need, when you need it.  We recommend signing up for the patient portal called "MyChart".  Sign up information is provided on this After Visit Summary.  MyChart is used to connect with patients for Virtual Visits (Telemedicine).  Patients are able to view lab/test results, encounter notes, upcoming  appointments, etc.  Non-urgent messages can be sent to your provider as well.   To learn more about what you can do with MyChart, go to [email or web address].    Your next appointment:   1 year(s)  The format for your next appointment:   In Person  Provider:   Reatha Harps, MD    Other Instructions

## 2023-12-29 ENCOUNTER — Other Ambulatory Visit: Payer: Self-pay | Admitting: Cardiovascular Disease

## 2023-12-29 DIAGNOSIS — E782 Mixed hyperlipidemia: Secondary | ICD-10-CM

## 2023-12-29 DIAGNOSIS — R42 Dizziness and giddiness: Secondary | ICD-10-CM

## 2023-12-29 DIAGNOSIS — I493 Ventricular premature depolarization: Secondary | ICD-10-CM

## 2024-01-12 ENCOUNTER — Ambulatory Visit (HOSPITAL_BASED_OUTPATIENT_CLINIC_OR_DEPARTMENT_OTHER)
Admission: RE | Admit: 2024-01-12 | Discharge: 2024-01-12 | Disposition: A | Discharge status: Home / Self Care | Source: Ambulatory Visit | Attending: Cardiovascular Disease | Admitting: Cardiovascular Disease

## 2024-01-12 ENCOUNTER — Encounter: Payer: Self-pay | Admitting: Cardiovascular Disease

## 2024-01-12 DIAGNOSIS — I509 Heart failure, unspecified: Secondary | ICD-10-CM

## 2024-01-12 DIAGNOSIS — R42 Dizziness and giddiness: Secondary | ICD-10-CM | POA: Diagnosis present

## 2024-01-12 DIAGNOSIS — I493 Ventricular premature depolarization: Secondary | ICD-10-CM | POA: Diagnosis present

## 2024-01-12 LAB — ECHOCARDIOGRAM COMPLETE
AR max vel: 2.27 cm2
AV Area VTI: 2.48 cm2
AV Area mean vel: 2.46 cm2
AV Mean grad: 4 mmHg
AV Peak grad: 6.9 mmHg
Ao pk vel: 1.31 m/s
Area-P 1/2: 3.83 cm2
Calc EF: 65.6 %
S' Lateral: 2.9 cm
Single Plane A2C EF: 67.7 %
Single Plane A4C EF: 64.9 %

## 2024-01-30 ENCOUNTER — Ambulatory Visit: Payer: Self-pay | Admitting: Cardiology
